# Patient Record
Sex: Female | Born: 1960 | Race: White | Hispanic: No | Smoking: Former smoker
Health system: Southern US, Community
[De-identification: ages and names within clinical notes are randomized; demographics above are authoritative.]

## PROBLEM LIST (undated history)

## (undated) DIAGNOSIS — R112 Nausea with vomiting, unspecified: Secondary | ICD-10-CM

## (undated) DIAGNOSIS — G43909 Migraine, unspecified, not intractable, without status migrainosus: Secondary | ICD-10-CM

## (undated) DIAGNOSIS — M1990 Unspecified osteoarthritis, unspecified site: Secondary | ICD-10-CM

## (undated) DIAGNOSIS — Z9889 Other specified postprocedural states: Secondary | ICD-10-CM

## (undated) DIAGNOSIS — I10 Essential (primary) hypertension: Secondary | ICD-10-CM

## (undated) DIAGNOSIS — F419 Anxiety disorder, unspecified: Secondary | ICD-10-CM

## (undated) DIAGNOSIS — K219 Gastro-esophageal reflux disease without esophagitis: Secondary | ICD-10-CM

## (undated) DIAGNOSIS — E785 Hyperlipidemia, unspecified: Secondary | ICD-10-CM

## (undated) HISTORY — DX: Migraine, unspecified, not intractable, without status migrainosus: G43.909

## (undated) HISTORY — PX: OTHER SURGICAL HISTORY: SHX169

## (undated) HISTORY — DX: Unspecified osteoarthritis, unspecified site: M19.90

## (undated) HISTORY — DX: Essential (primary) hypertension: I10

## (undated) HISTORY — DX: Hyperlipidemia, unspecified: E78.5

## (undated) HISTORY — DX: Gastro-esophageal reflux disease without esophagitis: K21.9

---

## 1998-05-27 HISTORY — PX: OTHER SURGICAL HISTORY: SHX169

## 2010-09-15 LAB — HM COLONOSCOPY

## 2013-07-29 LAB — HM COLONOSCOPY

## 2015-12-21 LAB — HM COLONOSCOPY

## 2016-07-10 LAB — LIPID PANEL
Cholesterol: 216 — AB (ref 0–200)
HDL: 46 (ref 35–70)
LDL Cholesterol: 115
LDl/HDL Ratio: 4.7
Triglycerides: 384 — AB (ref 40–160)

## 2016-07-10 LAB — HEPATIC FUNCTION PANEL
ALT: 17 (ref 7–35)
AST: 22 (ref 13–35)
Alkaline Phosphatase: 66 (ref 25–125)
Bilirubin, Direct: 0.1 (ref 0.01–0.4)
Bilirubin, Total: 0.6

## 2016-07-10 LAB — CBC AND DIFFERENTIAL
HCT: 42 (ref 36–46)
Hemoglobin: 14.1 (ref 12.0–16.0)
Neutrophils Absolute: 5032
Platelets: 222 (ref 150–399)
WBC: 7.9

## 2016-07-10 LAB — BASIC METABOLIC PANEL
Creatinine: 0.7 (ref 0.5–1.1)
Glucose: 93
Potassium: 4.2 (ref 3.4–5.3)
Sodium: 141 (ref 137–147)

## 2016-07-10 LAB — TSH: TSH: 1.84 (ref 0.41–5.90)

## 2016-09-14 LAB — HM COLONOSCOPY

## 2018-11-02 ENCOUNTER — Telehealth: Payer: Self-pay | Admitting: Family Medicine

## 2018-11-02 NOTE — Telephone Encounter (Signed)
Pt called in asking if Dr.Tabori will take her own as a new pt, she wanted  States that her Sister [employer] is a pt here.

## 2018-11-02 NOTE — Telephone Encounter (Signed)
Ok to establish 

## 2018-11-03 NOTE — Telephone Encounter (Signed)
LM making pt aware she can schedule a NP APPT when ready.

## 2018-11-27 NOTE — Telephone Encounter (Signed)
Patient is calling schedule New Patient appt. Virtually. Patient is interested in 12:30p no specific day Please advise [phone]

## 2018-12-21 ENCOUNTER — Ambulatory Visit (INDEPENDENT_AMBULATORY_CARE_PROVIDER_SITE_OTHER): Payer: Managed Care, Other (non HMO) | Admitting: Family Medicine

## 2018-12-21 ENCOUNTER — Encounter: Payer: Self-pay | Admitting: Family Medicine

## 2018-12-21 ENCOUNTER — Other Ambulatory Visit: Payer: Self-pay

## 2018-12-21 VITALS — BP 130/81 | HR 64 | Temp 97.1°F | Resp 16 | Ht 63.0 in | Wt 175.5 lb

## 2018-12-21 DIAGNOSIS — Z411 Encounter for cosmetic surgery: Secondary | ICD-10-CM

## 2018-12-21 DIAGNOSIS — Z1239 Encounter for other screening for malignant neoplasm of breast: Secondary | ICD-10-CM

## 2018-12-21 DIAGNOSIS — Z800 Family history of malignant neoplasm of digestive organs: Secondary | ICD-10-CM | POA: Insufficient documentation

## 2018-12-21 DIAGNOSIS — F411 Generalized anxiety disorder: Secondary | ICD-10-CM | POA: Diagnosis not present

## 2018-12-21 DIAGNOSIS — E782 Mixed hyperlipidemia: Secondary | ICD-10-CM | POA: Diagnosis not present

## 2018-12-21 DIAGNOSIS — E785 Hyperlipidemia, unspecified: Secondary | ICD-10-CM | POA: Insufficient documentation

## 2018-12-21 DIAGNOSIS — E669 Obesity, unspecified: Secondary | ICD-10-CM | POA: Diagnosis not present

## 2018-12-21 LAB — CBC WITH DIFFERENTIAL/PLATELET
Basophils Absolute: 0 10*3/uL (ref 0.0–0.1)
Basophils Relative: 0.2 % (ref 0.0–3.0)
Eosinophils Absolute: 0.1 10*3/uL (ref 0.0–0.7)
Eosinophils Relative: 1.1 % (ref 0.0–5.0)
HCT: 40 % (ref 36.0–46.0)
Hemoglobin: 13.2 g/dL (ref 12.0–15.0)
Lymphocytes Relative: 35.9 % (ref 12.0–46.0)
Lymphs Abs: 2.2 10*3/uL (ref 0.7–4.0)
MCHC: 33.2 g/dL (ref 30.0–36.0)
MCV: 83.9 fl (ref 78.0–100.0)
Monocytes Absolute: 0.4 10*3/uL (ref 0.1–1.0)
Monocytes Relative: 6.9 % (ref 3.0–12.0)
Neutro Abs: 3.4 10*3/uL (ref 1.4–7.7)
Neutrophils Relative %: 55.9 % (ref 43.0–77.0)
Platelets: 233 10*3/uL (ref 150.0–400.0)
RBC: 4.76 Mil/uL (ref 3.87–5.11)
RDW: 14 % (ref 11.5–15.5)
WBC: 6.1 10*3/uL (ref 4.0–10.5)

## 2018-12-21 LAB — BASIC METABOLIC PANEL
BUN: 10 mg/dL (ref 6–23)
CO2: 29 mEq/L (ref 19–32)
Calcium: 10 mg/dL (ref 8.4–10.5)
Chloride: 100 mEq/L (ref 96–112)
Creatinine, Ser: 0.68 mg/dL (ref 0.40–1.20)
GFR: 88.23 mL/min (ref >60.00)
Glucose, Bld: 84 mg/dL (ref 70–99)
Potassium: 4.1 mEq/L (ref 3.5–5.1)
Sodium: 138 mEq/L (ref 135–145)

## 2018-12-21 LAB — LIPID PANEL
Cholesterol: 222 mg/dL — ABNORMAL HIGH (ref 0–200)
HDL: 56.7 mg/dL (ref >39.00)
LDL Cholesterol: 134 mg/dL — ABNORMAL HIGH (ref 0–99)
NonHDL: 165.79
Total CHOL/HDL Ratio: 4
Triglycerides: 161 mg/dL — ABNORMAL HIGH (ref 0.0–149.0)
VLDL: 32.2 mg/dL (ref 0.0–40.0)

## 2018-12-21 LAB — HEPATIC FUNCTION PANEL
ALT: 18 U/L (ref 0–35)
AST: 20 U/L (ref 0–37)
Albumin: 4.8 g/dL (ref 3.5–5.2)
Alkaline Phosphatase: 66 U/L (ref 39–117)
Bilirubin, Direct: 0.1 mg/dL (ref 0.0–0.3)
Total Bilirubin: 0.9 mg/dL (ref 0.2–1.2)
Total Protein: 7.2 g/dL (ref 6.0–8.3)

## 2018-12-21 LAB — TSH: TSH: 1.36 u[IU]/mL (ref 0.35–4.50)

## 2018-12-21 NOTE — Patient Instructions (Addendum)
Schedule your complete physical in 6 months- we'll do a pap at that time [facility] notify you of your lab results and make any changes if needed Continue to work on healthy diet and regular exercise- goal is 30 minutes of exercise 4-5x/week We'll call you with your GI referral, plastic surgery referral, and mammogram Call with any questions or concerns Welcome!  We're glad to have you! Stay Safe!!!

## 2018-12-21 NOTE — Progress Notes (Signed)
   Subjective:    Patient ID: Namiyah Grantham, female    DOB: [birth year], 58 y.o.   MRN: [id number]  HPI New to establish.  Recently moved from [place].    Family history of colon cancer- sister dx'd at age 39, passed at age 27.  Has not had any abnormal colonoscopies herself w/ exception of 'twisted colon'.  Has been having colonoscopy q3 yrs.  Due now.  Anxiety- pt has GAD.  Currently on Prozac 60mg  daily.  Feels sxs are currently well controlled.  Some difficulty w/ sleep- takes OTC sleep aide.  Hyperlipidemia- pt reports LDL and triglycerides are both elevated.  Not currently on medication.  Obesity- pt is walking 3x/week.  Just purchased elliptical to use.  Pt is following FODMAP diet due to IBS.  Abstains from dairy.  Pt is having a difficult time losing weight despite eating well.  Health maintenance- due for mammo, pt has not had pap in ~10 yrs.   Review of Systems For ROS see HPI     Objective:   Physical Exam Vitals signs reviewed.  Constitutional:      General: She is not in acute distress.    Appearance: She is well-developed. She is obese.  HENT:     Head: Normocephalic and atraumatic.  Eyes:     Conjunctiva/sclera: Conjunctivae normal.     Pupils: Pupils are equal, round, and reactive to light.  Neck:     Musculoskeletal: Normal range of motion and neck supple.     Thyroid: No thyromegaly.  Cardiovascular:     Rate and Rhythm: Normal rate and regular rhythm.     Heart sounds: Normal heart sounds. No murmur.  Pulmonary:     Effort: Pulmonary effort is normal. No respiratory distress.     Breath sounds: Normal breath sounds.  Abdominal:     General: There is no distension.     Palpations: Abdomen is soft.     Tenderness: There is no abdominal tenderness.  Lymphadenopathy:     Cervical: No cervical adenopathy.  Skin:    General: Skin is warm and dry.  Neurological:     Mental Status: She is alert and oriented to person, place, and time.  Psychiatric:      Behavior: Behavior normal.           Assessment & Plan:

## 2018-12-22 NOTE — Assessment & Plan Note (Signed)
New to provider, ongoing for pt.  Feels that sxs are currently controlled on Prozac.  Not interested in med change at this time.  Will continue to follow.

## 2018-12-22 NOTE — Assessment & Plan Note (Signed)
Pt reports she has a history of elevated lipids.  She has not been on medication previously.  Will get baseline labs today and determine if medication is needed

## 2018-12-22 NOTE — Assessment & Plan Note (Signed)
Pt's sister was dx'd at age 60 and passed at 42.  Pt has been having colonoscopy q3 yrs.  Due for one at this time.  Referral placed.

## 2018-12-22 NOTE — Assessment & Plan Note (Signed)
New to provider, ongoing for pt.  She reports this became an issue s/p menopause.  Encouraged increased exercise as well as close attention to diet.  Check labs to risk stratify.  Will follow.

## 2019-01-22 ENCOUNTER — Institutional Professional Consult (permissible substitution): Payer: Managed Care, Other (non HMO) | Admitting: Plastic Surgery

## 2019-02-03 ENCOUNTER — Telehealth: Payer: Self-pay | Admitting: Gastroenterology

## 2019-02-03 NOTE — Telephone Encounter (Signed)
Hi Dr. Tarri Glenn, this patient have been referred for a repeat colon due to fm hx colon cancer. She had a colon 5 years ago. We have received her records. They will be placed on your desk for review. Patient is requesting to see a female physician. Please advise on scheduling.

## 2019-02-04 NOTE — Telephone Encounter (Signed)
I will be happy to review when I return from vacation next week. KLB

## 2019-02-10 ENCOUNTER — Telehealth: Payer: Self-pay | Admitting: Gastroenterology

## 2019-02-10 NOTE — Telephone Encounter (Signed)
Pt returned call and said she was going to speak with her PCP first before scheduling an OV because she does not understand the "guidelines"  Offered to schedule her a OV with Dr. Tarri Glenn to discuss but she wants to speak with PCP first.  Records placed in [employer] filing cabinet in the records reviewed folder

## 2019-02-12 ENCOUNTER — Encounter: Payer: Self-pay | Admitting: Family Medicine

## 2019-02-12 ENCOUNTER — Other Ambulatory Visit: Payer: Self-pay

## 2019-02-12 ENCOUNTER — Ambulatory Visit: Payer: Managed Care, Other (non HMO) | Admitting: Family Medicine

## 2019-02-12 VITALS — BP 130/86 | HR 76 | Temp 97.8°F | Resp 16 | Ht 63.0 in | Wt 180.0 lb

## 2019-02-12 DIAGNOSIS — H9193 Unspecified hearing loss, bilateral: Secondary | ICD-10-CM | POA: Diagnosis not present

## 2019-02-12 DIAGNOSIS — H9313 Tinnitus, bilateral: Secondary | ICD-10-CM | POA: Diagnosis not present

## 2019-02-12 DIAGNOSIS — Z23 Encounter for immunization: Secondary | ICD-10-CM

## 2019-02-12 NOTE — Patient Instructions (Signed)
Follow up as needed or as scheduled We'll call you with your ENT appt Noise reduction and hearing protection will reduce further damage Call with any questions or concerns Stay Safe!!!
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
## 2019-02-19 ENCOUNTER — Other Ambulatory Visit: Payer: Self-pay

## 2019-02-19 ENCOUNTER — Ambulatory Visit
Admission: RE | Admit: 2019-02-19 | Discharge: 2019-02-19 | Disposition: A | Discharge status: Home / Self Care | Payer: Managed Care, Other (non HMO) | Source: Ambulatory Visit | Attending: Family Medicine | Admitting: Family Medicine

## 2019-02-19 DIAGNOSIS — Z1239 Encounter for other screening for malignant neoplasm of breast: Secondary | ICD-10-CM

## 2019-03-12 ENCOUNTER — Other Ambulatory Visit: Payer: Self-pay | Admitting: Family Medicine

## 2019-03-12 DIAGNOSIS — R928 Other abnormal and inconclusive findings on diagnostic imaging of breast: Secondary | ICD-10-CM

## 2019-06-23 ENCOUNTER — Encounter: Payer: Self-pay | Admitting: Family Medicine

## 2019-06-23 ENCOUNTER — Ambulatory Visit (INDEPENDENT_AMBULATORY_CARE_PROVIDER_SITE_OTHER): Payer: Managed Care, Other (non HMO) | Admitting: Family Medicine

## 2019-06-23 ENCOUNTER — Other Ambulatory Visit: Payer: Self-pay

## 2019-06-23 VITALS — BP 124/84 | HR 60 | Temp 97.9°F | Resp 16 | Ht 63.0 in | Wt 180.0 lb

## 2019-06-23 DIAGNOSIS — E669 Obesity, unspecified: Secondary | ICD-10-CM | POA: Diagnosis not present

## 2019-06-23 DIAGNOSIS — E559 Vitamin D deficiency, unspecified: Secondary | ICD-10-CM | POA: Diagnosis not present

## 2019-06-23 DIAGNOSIS — Z0000 Encounter for general adult medical examination without abnormal findings: Secondary | ICD-10-CM | POA: Diagnosis not present

## 2019-06-23 DIAGNOSIS — Z124 Encounter for screening for malignant neoplasm of cervix: Secondary | ICD-10-CM | POA: Diagnosis not present

## 2019-06-23 LAB — CBC WITH DIFFERENTIAL/PLATELET
Basophils Absolute: 0 10*3/uL (ref 0.0–0.1)
Basophils Relative: 0.2 % (ref 0.0–3.0)
Eosinophils Absolute: 0.1 10*3/uL (ref 0.0–0.7)
Eosinophils Relative: 1 % (ref 0.0–5.0)
HCT: 40.5 % (ref 36.0–46.0)
Hemoglobin: 13.3 g/dL (ref 12.0–15.0)
Lymphocytes Relative: 38.2 % (ref 12.0–46.0)
Lymphs Abs: 2 10*3/uL (ref 0.7–4.0)
MCHC: 32.8 g/dL (ref 30.0–36.0)
MCV: 82.7 fl (ref 78.0–100.0)
Monocytes Absolute: 0.4 10*3/uL (ref 0.1–1.0)
Monocytes Relative: 7.5 % (ref 3.0–12.0)
Neutro Abs: 2.8 10*3/uL (ref 1.4–7.7)
Neutrophils Relative %: 53.1 % (ref 43.0–77.0)
Platelets: 217 10*3/uL (ref 150.0–400.0)
RBC: 4.9 Mil/uL (ref 3.87–5.11)
RDW: 13.9 % (ref 11.5–15.5)
WBC: 5.4 10*3/uL (ref 4.0–10.5)

## 2019-06-23 LAB — BASIC METABOLIC PANEL
BUN: 11 mg/dL (ref 6–23)
CO2: 30 mEq/L (ref 19–32)
Calcium: 9.7 mg/dL (ref 8.4–10.5)
Chloride: 101 mEq/L (ref 96–112)
Creatinine, Ser: 0.6 mg/dL (ref 0.40–1.20)
GFR: 101.77 mL/min (ref >60.00)
Glucose, Bld: 81 mg/dL (ref 70–99)
Potassium: 4 mEq/L (ref 3.5–5.1)
Sodium: 138 mEq/L (ref 135–145)

## 2019-06-23 LAB — LIPID PANEL
Cholesterol: 238 mg/dL — ABNORMAL HIGH (ref 0–200)
HDL: 55.7 mg/dL (ref >39.00)
LDL Cholesterol: 149 mg/dL — ABNORMAL HIGH (ref 0–99)
NonHDL: 182.74
Total CHOL/HDL Ratio: 4
Triglycerides: 167 mg/dL — ABNORMAL HIGH (ref 0.0–149.0)
VLDL: 33.4 mg/dL (ref 0.0–40.0)

## 2019-06-23 LAB — HEPATIC FUNCTION PANEL
ALT: 21 U/L (ref 0–35)
AST: 21 U/L (ref 0–37)
Albumin: 4.7 g/dL (ref 3.5–5.2)
Alkaline Phosphatase: 69 U/L (ref 39–117)
Bilirubin, Direct: 0.1 mg/dL (ref 0.0–0.3)
Total Bilirubin: 0.8 mg/dL (ref 0.2–1.2)
Total Protein: 7.1 g/dL (ref 6.0–8.3)

## 2019-06-23 LAB — TSH: TSH: 2.36 u[IU]/mL (ref 0.35–4.50)

## 2019-06-23 NOTE — Assessment & Plan Note (Signed)
Pt's PE WNL w/ exception of obesity.  UTD on colonoscopy, mammo, immunizations.  Attempted pap today but pt was not able to relax enough to tolerate and in her best interest, after 3 attempts we aborted our efforts.  I offered a GYN referral due to pt's level of pain but she declined and said she had no concerns or issues.  Check labs.  Anticipatory guidance provided.

## 2019-06-23 NOTE — Progress Notes (Signed)
   Subjective:    Patient ID: Erin Williamson, female    DOB: Jul 23, 1960, 59 y.o.   MRN: [id number]  HPI CPE- UTD on colonoscopy, mammo, flu.  Due for pap today.   Review of Systems Patient reports no vision/ hearing changes, adenopathy,fever, weight change,  persistant/recurrent hoarseness , swallowing issues, chest pain, palpitations, edema, persistant/recurrent cough, hemoptysis, dyspnea (rest/exertional/paroxysmal nocturnal), gastrointestinal bleeding (melena, rectal bleeding), abdominal pain, significant heartburn, bowel changes, GU symptoms (dysuria, hematuria, incontinence), Gyn symptoms (abnormal  bleeding, pain),  syncope, focal weakness, memory loss, numbness & tingling, skin/hair/nail changes, abnormal bruising or bleeding, anxiety, or depression.   This visit occurred during the SARS-CoV-2 public health emergency.  Safety protocols were in place, including screening questions prior to the visit, additional usage of staff PPE, and extensive cleaning of exam room while observing appropriate contact time as indicated for disinfecting solutions.       Objective:   Physical Exam  General Appearance:    Alert, cooperative, no distress, appears stated age  Head:    Normocephalic, without obvious abnormality, atraumatic  Eyes:    PERRL, conjunctiva/corneas clear, EOM's intact, fundi    benign, both eyes  Ears:    Normal TM's and external ear canals, both ears  Nose:   Nares normal, septum midline, mucosa normal, no drainage    or sinus tenderness  Throat:   Lips, mucosa, and tongue normal; teeth and gums normal  Neck:   Supple, symmetrical, trachea midline, no adenopathy;    Thyroid: no enlargement/tenderness/nodules  Back:     Symmetric, no curvature, ROM normal, no CVA tenderness  Lungs:     Clear to auscultation bilaterally, respirations unlabored  Chest Wall:    No tenderness or deformity   Heart:    Regular rate and rhythm, S1 and S2 normal, no murmur, rub   or gallop  Breast  Exam:    No tenderness, masses, or nipple abnormality  Abdomen:     Soft, non-tender, bowel sounds active all four quadrants,    no masses, no organomegaly  Genitalia:    External genitalia normal, cervix normal in appearance, mucosa pink and moist, no lesions or discharge present, pt did not tolerate speculum exam enough to collect pap  Rectal:    Normal external appearance  Extremities:   Extremities normal, atraumatic, no cyanosis or edema  Pulses:   2+ and symmetric all extremities  Skin:   Skin color, texture, turgor normal, no rashes or lesions  Lymph nodes:   Cervical, supraclavicular, and axillary nodes normal  Neurologic:   CNII-XII intact, normal strength, sensation and reflexes    throughout          Assessment & Plan:

## 2019-06-23 NOTE — Assessment & Plan Note (Signed)
Check labs and replete prn. 

## 2019-06-23 NOTE — Assessment & Plan Note (Signed)
Ongoing issue.  Encouraged healthy diet and regular exercise.  Check labs to risk stratify.  Will follow.

## 2019-06-23 NOTE — Patient Instructions (Signed)
Follow up in 1 year or as needed We'll notify you of your lab results and make any changes if needed Continue to work on healthy diet and regular exercise Call with any questions or concerns Stay Safe!  Stay healthy!

## 2019-07-05 ENCOUNTER — Telehealth: Payer: Self-pay | Admitting: Family Medicine

## 2019-07-05 NOTE — Telephone Encounter (Signed)
FYI

## 2019-07-05 NOTE — Telephone Encounter (Signed)
Pt looking for Vit D lab results from late January visit.

## 2019-07-05 NOTE — Telephone Encounter (Signed)
Spoke with Santiago Glad at the lab, test was not performed. Called and offered patient lab appt at our office and [place] location to be accommodating for her work schedule, patient declines. States she will have it checked when she comes in for next visit. Vit D still in chart as future.

## 2019-07-05 NOTE — Telephone Encounter (Signed)
Were these ever collected?

## 2019-09-07 ENCOUNTER — Other Ambulatory Visit: Payer: Self-pay | Admitting: Family Medicine

## 2019-09-07 MED ORDER — FLUOXETINE HCL 20 MG PO CAPS: 40.0000 mg | ORAL_CAPSULE | Freq: Every day | ORAL | 1 refills | Status: DC

## 2019-09-07 NOTE — Telephone Encounter (Signed)
Pt called in asking for a refill on the Fluoxetine  [facility] [address].

## 2019-09-07 NOTE — Telephone Encounter (Signed)
Please advise, this is listed as historical provider.

## 2020-02-13 ENCOUNTER — Encounter: Payer: Self-pay | Admitting: Family Medicine

## 2020-02-21 MED ORDER — FLUOXETINE HCL 20 MG PO CAPS: 60.0000 mg | ORAL_CAPSULE | Freq: Every day | ORAL | 1 refills | Status: DC

## 2020-03-12 ENCOUNTER — Other Ambulatory Visit: Payer: Self-pay | Admitting: Family Medicine

## 2020-04-06 ENCOUNTER — Encounter: Payer: Self-pay | Admitting: Family Medicine

## 2020-04-06 ENCOUNTER — Telehealth: Payer: Self-pay | Admitting: Family Medicine

## 2020-04-06 NOTE — Telephone Encounter (Signed)
Pt called in asking if we can send an order for a mammogram to GI- [facility]

## 2020-04-06 NOTE — Telephone Encounter (Signed)
Ok for screening mammo (dx z12.31) at [facility]

## 2020-04-06 NOTE — Telephone Encounter (Signed)
Patient wants to know if we can send an order for a mammogram to GI. Please advise!

## 2020-04-07 ENCOUNTER — Other Ambulatory Visit: Payer: Self-pay

## 2020-04-07 DIAGNOSIS — Z1231 Encounter for screening mammogram for malignant neoplasm of breast: Secondary | ICD-10-CM

## 2020-04-07 NOTE — Telephone Encounter (Signed)
Order placed for mammogram screening.

## 2020-04-24 ENCOUNTER — Other Ambulatory Visit: Payer: Self-pay

## 2020-04-24 ENCOUNTER — Other Ambulatory Visit: Payer: Self-pay | Admitting: Family Medicine

## 2020-04-24 ENCOUNTER — Ambulatory Visit
Admission: RE | Admit: 2020-04-24 | Discharge: 2020-04-24 | Disposition: A | Discharge status: Home / Self Care | Payer: Managed Care, Other (non HMO) | Source: Ambulatory Visit | Attending: Family Medicine | Admitting: Family Medicine

## 2020-04-24 DIAGNOSIS — Z1231 Encounter for screening mammogram for malignant neoplasm of breast: Secondary | ICD-10-CM

## 2020-04-26 ENCOUNTER — Ambulatory Visit: Payer: Managed Care, Other (non HMO) | Admitting: Family Medicine

## 2020-06-23 ENCOUNTER — Encounter: Payer: Self-pay | Admitting: Family Medicine

## 2020-06-23 ENCOUNTER — Other Ambulatory Visit: Payer: Self-pay

## 2020-06-23 ENCOUNTER — Ambulatory Visit (INDEPENDENT_AMBULATORY_CARE_PROVIDER_SITE_OTHER): Payer: Managed Care, Other (non HMO) | Admitting: Family Medicine

## 2020-06-23 VITALS — BP 118/68 | HR 62 | Temp 97.8°F | Resp 20 | Ht 63.0 in | Wt 179.6 lb

## 2020-06-23 DIAGNOSIS — E559 Vitamin D deficiency, unspecified: Secondary | ICD-10-CM

## 2020-06-23 DIAGNOSIS — E669 Obesity, unspecified: Secondary | ICD-10-CM | POA: Diagnosis not present

## 2020-06-23 DIAGNOSIS — Z0000 Encounter for general adult medical examination without abnormal findings: Secondary | ICD-10-CM | POA: Diagnosis not present

## 2020-06-23 DIAGNOSIS — G8929 Other chronic pain: Secondary | ICD-10-CM

## 2020-06-23 DIAGNOSIS — M25561 Pain in right knee: Secondary | ICD-10-CM

## 2020-06-23 DIAGNOSIS — M25562 Pain in left knee: Secondary | ICD-10-CM

## 2020-06-23 LAB — CBC WITH DIFFERENTIAL/PLATELET
Basophils Absolute: 0 10*3/uL (ref 0.0–0.1)
Basophils Relative: 0.3 % (ref 0.0–3.0)
Eosinophils Absolute: 0.1 10*3/uL (ref 0.0–0.7)
Eosinophils Relative: 1.4 % (ref 0.0–5.0)
HCT: 39.1 % (ref 36.0–46.0)
Hemoglobin: 13.1 g/dL (ref 12.0–15.0)
Lymphocytes Relative: 36.2 % (ref 12.0–46.0)
Lymphs Abs: 2.1 10*3/uL (ref 0.7–4.0)
MCHC: 33.6 g/dL (ref 30.0–36.0)
MCV: 81.7 fl (ref 78.0–100.0)
Monocytes Absolute: 0.4 10*3/uL (ref 0.1–1.0)
Monocytes Relative: 7 % (ref 3.0–12.0)
Neutro Abs: 3.2 10*3/uL (ref 1.4–7.7)
Neutrophils Relative %: 55.1 % (ref 43.0–77.0)
Platelets: 229 10*3/uL (ref 150.0–400.0)
RBC: 4.78 Mil/uL (ref 3.87–5.11)
RDW: 14.2 % (ref 11.5–15.5)
WBC: 5.7 10*3/uL (ref 4.0–10.5)

## 2020-06-23 LAB — LIPID PANEL
Cholesterol: 233 mg/dL — ABNORMAL HIGH (ref 0–200)
HDL: 57.9 mg/dL (ref >39.00)
LDL Cholesterol: 144 mg/dL — ABNORMAL HIGH (ref 0–99)
NonHDL: 175.07
Total CHOL/HDL Ratio: 4
Triglycerides: 156 mg/dL — ABNORMAL HIGH (ref 0.0–149.0)
VLDL: 31.2 mg/dL (ref 0.0–40.0)

## 2020-06-23 LAB — BASIC METABOLIC PANEL
BUN: 16 mg/dL (ref 6–23)
CO2: 31 mEq/L (ref 19–32)
Calcium: 9.7 mg/dL (ref 8.4–10.5)
Chloride: 101 mEq/L (ref 96–112)
Creatinine, Ser: 0.66 mg/dL (ref 0.40–1.20)
GFR: 94.55 mL/min (ref >60.00)
Glucose, Bld: 81 mg/dL (ref 70–99)
Potassium: 3.8 mEq/L (ref 3.5–5.1)
Sodium: 137 mEq/L (ref 135–145)

## 2020-06-23 LAB — HEPATIC FUNCTION PANEL
ALT: 18 U/L (ref 0–35)
AST: 21 U/L (ref 0–37)
Albumin: 4.6 g/dL (ref 3.5–5.2)
Alkaline Phosphatase: 71 U/L (ref 39–117)
Bilirubin, Direct: 0.1 mg/dL (ref 0.0–0.3)
Total Bilirubin: 0.9 mg/dL (ref 0.2–1.2)
Total Protein: 7.5 g/dL (ref 6.0–8.3)

## 2020-06-23 LAB — TSH: TSH: 1.53 u[IU]/mL (ref 0.35–4.50)

## 2020-06-23 LAB — VITAMIN D 25 HYDROXY (VIT D DEFICIENCY, FRACTURES): VITD: 35.11 ng/mL (ref 30.00–100.00)

## 2020-06-23 NOTE — Progress Notes (Signed)
   Subjective:    Patient ID: Erin Williamson, female    DOB: 01-02-1961, 60 y.o.   MRN: [id number]  HPI CPE-UTD on colonoscopy, mammo, COVID.  Declines flu and Tdap.  Declines pap  Reviewed past medical, surgical, family and social histories.   Health Maintenance  Topic Date Due  . COVID-19 Vaccine (2 - Booster for [employer] series) 09/30/2019  . INFLUENZA VACCINE  08/24/2020 (Originally 12/26/2019)  . TETANUS/TDAP  01/09/2021 (Originally 11/12/1977)  . Hepatitis C Screening  06/23/2021 (Originally 1960-11-21)  . HIV Screening  06/23/2021 (Originally 11/12/1973)  . PAP SMEAR-Modifier  06/23/2023 (Originally 11/13/1979)  . MAMMOGRAM  04/24/2022  . COLONOSCOPY (Pts 45-6yrs Insurance coverage will need to be confirmed)  12/20/2025      Review of Systems Patient reports no vision/ hearing changes, adenopathy,fever, weight change,  persistant/recurrent hoarseness , swallowing issues, chest pain, palpitations, edema, persistant/recurrent cough, hemoptysis, dyspnea (rest/exertional/paroxysmal nocturnal), gastrointestinal bleeding (melena, rectal bleeding), abdominal pain, significant heartburn, bowel changes, GU symptoms (dysuria, hematuria, incontinence), Gyn symptoms (abnormal  bleeding, pain),  syncope, focal weakness, memory loss, numbness & tingling, skin/hair/nail changes, abnormal bruising or bleeding, anxiety, or depression.   Bilateral knee pain- L>R  This visit occurred during the SARS-CoV-2 public health emergency.  Safety protocols were in place, including screening questions prior to the visit, additional usage of staff PPE, and extensive cleaning of exam room while observing appropriate contact time as indicated for disinfecting solutions.       Objective:   Physical Exam General Appearance:    Alert, cooperative, no distress, appears stated age, obese  Head:    Normocephalic, without obvious abnormality, atraumatic  Eyes:    PERRL, conjunctiva/corneas clear, EOM's intact, fundi     benign, both eyes  Ears:    Normal TM's and external ear canals, both ears  Nose:   Nares normal, septum midline, mucosa normal, no drainage    or sinus tenderness  Throat:   Lips, mucosa, and tongue normal; teeth and gums normal  Neck:   Supple, symmetrical, trachea midline, no adenopathy;    Thyroid: no enlargement/tenderness/nodules  Back:     Symmetric, no curvature, ROM normal, no CVA tenderness  Lungs:     Clear to auscultation bilaterally, respirations unlabored  Chest Wall:    No tenderness or deformity   Heart:    Regular rate and rhythm, S1 and S2 normal, no murmur, rub   or gallop  Breast Exam:    Deferred to mammo  Abdomen:     Soft, non-tender, bowel sounds active all four quadrants,    no masses, no organomegaly  Genitalia:    Refuses  Rectal:    Extremities:   Extremities normal, atraumatic, no cyanosis or edema  Pulses:   2+ and symmetric all extremities  Skin:   Skin color, texture, turgor normal, no rashes or lesions  Lymph nodes:   Cervical, supraclavicular, and axillary nodes normal  Neurologic:   CNII-XII intact, normal strength, sensation and reflexes    throughout          Assessment & Plan:

## 2020-06-23 NOTE — Patient Instructions (Signed)
Follow up in 1 year or as needed We'll notify you of your lab results and make any changes if needed Continue to work on healthy diet and regular exercise- you can do it! We'll call you with your orthopedic appt for the knees Please get your COVID booster Call with any questions or concerns Stay Safe!  Stay Healthy!

## 2020-06-25 NOTE — Assessment & Plan Note (Signed)
BMI 31.81  Stressed need for healthy diet and regular exercise.  Check labs to risk stratify.  Will follow

## 2020-06-25 NOTE — Assessment & Plan Note (Signed)
Pt's PE WNL w/ exception of obesity.  UTD on mammo, colonoscopy.  Refuses pap.  Refuses flu shot, Tdap, COVID booster.  Check labs.  Anticipatory guidance provided.

## 2020-06-25 NOTE — Assessment & Plan Note (Signed)
Pt has hx of this.  Check labs and replete prn. 

## 2020-06-26 ENCOUNTER — Encounter: Payer: Self-pay | Admitting: *Deleted

## 2020-07-07 ENCOUNTER — Encounter: Payer: Self-pay | Admitting: Family Medicine

## 2020-07-07 ENCOUNTER — Ambulatory Visit: Payer: Managed Care, Other (non HMO) | Admitting: Family Medicine

## 2020-07-07 ENCOUNTER — Ambulatory Visit: Payer: Self-pay

## 2020-07-07 ENCOUNTER — Other Ambulatory Visit: Payer: Self-pay

## 2020-07-07 ENCOUNTER — Ambulatory Visit (INDEPENDENT_AMBULATORY_CARE_PROVIDER_SITE_OTHER): Payer: Managed Care, Other (non HMO)

## 2020-07-07 DIAGNOSIS — M25561 Pain in right knee: Secondary | ICD-10-CM

## 2020-07-07 DIAGNOSIS — G8929 Other chronic pain: Secondary | ICD-10-CM | POA: Diagnosis not present

## 2020-07-07 DIAGNOSIS — M25562 Pain in left knee: Secondary | ICD-10-CM

## 2020-07-07 MED ORDER — MELOXICAM 15 MG PO TABS: 7.5000 mg | ORAL_TABLET | Freq: Every day | ORAL | 6 refills | Status: DC | PRN

## 2020-07-07 NOTE — Patient Instructions (Signed)
    -   Glucosamine Sulfate 1,000 mg twice daily  - Turmeric 500 mg twice daily  - Quadriceps strengthening  - Hamstring stretching    Knee brace:  [email or web address]   Other future options:  - Cortisone injection  - "Gel" injection (hyaluronic acid)  - Platelet-rich plasma (PRP)

## 2020-07-07 NOTE — Progress Notes (Signed)
Office Visit Note   Patient: Erin Williamson           Date of Birth: [birth year]           MRN: [id number] Visit Date: 07/07/2020 Requested by: Midge Minium, MD [address] [place],  [place] [address] PCP: Midge Minium, MD  Subjective: Chief Complaint  Patient presents with  . Right Knee - Pain    Chronic pain bilateral knees, left worse than right. Popping. Swelling bilaterally.  . Left Knee - Pain    HPI: She is here with left greater than right knee pain.  Longstanding problems with her knees, probably more than 10 years.  When she was in [place] she had an MRI scan of the left knee.  No surgery was done.  She has managed her pain with over-the-counter remedies.  Lately the pain seems to have gotten worse, especially the left knee.  It pops frequently, it hurts to squat or kneel.  When her daughter had arthroscopic knee surgery, she had anti-inflammatories and patient tried one once with good results but she cannot remember the name of it.  She is not currently taking any anti-inflammatories but she does consume a lot of turmeric.                ROS:   All other systems were reviewed and are negative.  Objective: Vital Signs: There were no vitals taken for this visit.  Physical Exam:  General:  Alert and oriented, in no acute distress. Pulm:  Breathing unlabored. Psy:  Normal mood, congruent affect. Skin: No rash Knees: She has 2+ patellofemoral crepitus bilaterally.  Mild pain with patella compression each knee.  1+ effusion on the left, trace on the right.  Ligaments feel stable but she does have pseudolaxity with valgus stress in both knees.  Moderately tender on the medial joint line of both knees but no palpable click with McMurray's.    Imaging: XR KNEE 3 VIEW LEFT  Result Date: 07/07/2020 Three-view x-rays of the left knee reveal moderate to severe medial compartment narrowing, with moderate patellofemoral spurring.  No sign of loose body or  stress fracture.  XR KNEE 3 VIEW RIGHT  Result Date: 07/07/2020 Three-view x-rays of the right knee reveal moderate to severe medial compartment joint space narrowing with moderate patellofemoral spurring.   Assessment & Plan: 1.  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  If symptoms worsen, could contemplate a one-time steroid injection or possibly gel injections.  Medial compartment unloading brace is another consideration.     Procedures: No procedures performed        PMFS History: Patient Active Problem List   Diagnosis Date Noted  . Physical exam 06/23/2019  . Vitamin D deficiency 06/23/2019  . Family history of colon cancer 12/21/2018  . Generalized anxiety disorder 12/21/2018  . Hyperlipidemia 12/21/2018  . Obesity (BMI 30-39.9) 12/21/2018   Past Medical History:  Diagnosis Date  . Arthritis   . GERD (gastroesophageal reflux disease)   . Hyperlipidemia   . Hypertension   . Migraine     Family History  Problem Relation Age of Onset  . COPD Mother   . Heart attack Mother   . Heart disease Mother   . Hyperlipidemia Mother   . Hypertension Mother   . Early death Father   . Stroke  Sister   . Mental illness Son   . Arthritis Sister   . Cancer Sister        colon    Past Surgical History:  Procedure Laterality Date  . [place] [address]  . tummy tuck     Social History   Occupational History  . Not on file  Tobacco Use  . Smoking status: Never Smoker  . Smokeless tobacco: Never Used  Vaping Use  . Vaping Use: Never used  Substance and Sexual Activity  . Alcohol use: Yes  . Drug use: Never  . Sexual activity: Not Currently

## 2020-09-23 ENCOUNTER — Other Ambulatory Visit: Payer: Self-pay | Admitting: Family Medicine

## 2020-10-12 ENCOUNTER — Encounter: Payer: Self-pay | Admitting: Family Medicine

## 2020-10-12 ENCOUNTER — Encounter: Payer: Managed Care, Other (non HMO) | Admitting: Family Medicine

## 2020-10-12 NOTE — Progress Notes (Signed)
I connected with  Erin Williamson on 10/12/20 by a video enabled telemedicine application and verified that I am speaking with the correct person using two identifiers.   I discussed the limitations of evaluation and management by telemedicine. The patient expressed understanding and agreed to proceed.

## 2020-10-12 NOTE — Progress Notes (Signed)
This encounter was created in error - please disregard.

## 2020-10-16 ENCOUNTER — Telehealth: Payer: Self-pay | Admitting: Family Medicine

## 2020-10-16 NOTE — Telephone Encounter (Signed)
She was COVID + last week and should not be having dental work tomorrow as she has not been in [employer]

## 2020-10-16 NOTE — Telephone Encounter (Signed)
Unless she has a shunt in her head, no antibiotics are required after intracranial surgery once 3 months have passed.  No antibiotics are needed for her upcoming dental appt

## 2020-10-16 NOTE — Telephone Encounter (Signed)
Patient states she fist tested positive on 5/13/ and would like to know when do you advise it is safe for her to have dental work?

## 2020-10-16 NOTE — Telephone Encounter (Signed)
Since it would require them to be in her mouth and exposed, I would say 14 days.  But ultimately that is up to her dentist

## 2020-10-16 NOTE — Telephone Encounter (Signed)
Patient aware.

## 2020-10-16 NOTE — Telephone Encounter (Signed)
Patient states she will reschedule her appointment to be on the safe side but if we can send in the medications to be on hand when she needs them.

## 2020-10-16 NOTE — Telephone Encounter (Signed)
I don't have documentation of a medical issue or past surgical issue that would require antibiotic prophylaxis prior to dental work.  What does she need the medication for?

## 2020-10-16 NOTE — Telephone Encounter (Signed)
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 She had surgery for that where they had to cut into her head. She states they told her that she would need to premedicate. She states she has not been to the dentist in a while but can try and get documentation if needed.

## 2020-10-16 NOTE — Telephone Encounter (Signed)
Patient is having dental work done tomorrow - she requires pre treatment antibiotic - [facility] [address] in [place].  Please advise.

## 2020-11-16 ENCOUNTER — Encounter: Payer: Self-pay | Admitting: Family Medicine

## 2020-11-22 ENCOUNTER — Encounter: Payer: Self-pay | Admitting: *Deleted

## 2021-04-03 ENCOUNTER — Emergency Department (HOSPITAL_COMMUNITY): Payer: Managed Care, Other (non HMO)

## 2021-04-03 ENCOUNTER — Encounter (HOSPITAL_COMMUNITY): Payer: Self-pay

## 2021-04-03 ENCOUNTER — Ambulatory Visit: Payer: Managed Care, Other (non HMO) | Admitting: Registered Nurse

## 2021-04-03 ENCOUNTER — Emergency Department (HOSPITAL_COMMUNITY)
Admission: EM | Admit: 2021-04-03 | Discharge: 2021-04-03 | Disposition: A | Discharge status: Home / Self Care | Payer: Managed Care, Other (non HMO) | Attending: Student | Admitting: Student

## 2021-04-03 DIAGNOSIS — R1013 Epigastric pain: Secondary | ICD-10-CM | POA: Diagnosis not present

## 2021-04-03 DIAGNOSIS — I10 Essential (primary) hypertension: Secondary | ICD-10-CM | POA: Insufficient documentation

## 2021-04-03 DIAGNOSIS — R9389 Abnormal findings on diagnostic imaging of other specified body structures: Secondary | ICD-10-CM

## 2021-04-03 DIAGNOSIS — F419 Anxiety disorder, unspecified: Secondary | ICD-10-CM | POA: Insufficient documentation

## 2021-04-03 LAB — COMPREHENSIVE METABOLIC PANEL
ALT: 24 U/L (ref 0–44)
AST: 25 U/L (ref 15–41)
Albumin: 4.8 g/dL (ref 3.5–5.0)
Alkaline Phosphatase: 66 U/L (ref 38–126)
Anion gap: 9 (ref 5–15)
BUN: 12 mg/dL (ref 8–23)
CO2: 28 mmol/L (ref 22–32)
Calcium: 9.8 mg/dL (ref 8.9–10.3)
Chloride: 102 mmol/L (ref 98–111)
Creatinine, Ser: 0.51 mg/dL (ref 0.44–1.00)
GFR, Estimated: >60 mL/min (ref >60)
Glucose, Bld: 114 mg/dL — ABNORMAL HIGH (ref 70–99)
Potassium: 3.8 mmol/L (ref 3.5–5.1)
Sodium: 139 mmol/L (ref 135–145)
Total Bilirubin: 1.2 mg/dL (ref 0.3–1.2)
Total Protein: 8.3 g/dL — ABNORMAL HIGH (ref 6.5–8.1)

## 2021-04-03 LAB — CBC WITH DIFFERENTIAL/PLATELET
Abs Immature Granulocytes: 0.02 10*3/uL (ref 0.00–0.07)
Basophils Absolute: 0 10*3/uL (ref 0.0–0.1)
Basophils Relative: 0 %
Eosinophils Absolute: 0 10*3/uL (ref 0.0–0.5)
Eosinophils Relative: 0 %
HCT: 44.3 % (ref 36.0–46.0)
Hemoglobin: 15 g/dL (ref 12.0–15.0)
Immature Granulocytes: 0 %
Lymphocytes Relative: 14 %
Lymphs Abs: 1.2 10*3/uL (ref 0.7–4.0)
MCH: 28 pg (ref 26.0–34.0)
MCHC: 33.9 g/dL (ref 30.0–36.0)
MCV: 82.8 fL (ref 80.0–100.0)
Monocytes Absolute: 0.4 10*3/uL (ref 0.1–1.0)
Monocytes Relative: 5 %
Neutro Abs: 6.6 10*3/uL (ref 1.7–7.7)
Neutrophils Relative %: 81 %
Platelets: 289 10*3/uL (ref 150–400)
RBC: 5.35 MIL/uL — ABNORMAL HIGH (ref 3.87–5.11)
RDW: 14.1 % (ref 11.5–15.5)
WBC: 8.2 10*3/uL (ref 4.0–10.5)
nRBC: 0 % (ref 0.0–0.2)

## 2021-04-03 LAB — TROPONIN I (HIGH SENSITIVITY): Troponin I (High Sensitivity): 3 ng/L (ref <18)

## 2021-04-03 LAB — LIPASE, BLOOD: Lipase: 33 U/L (ref 11–51)

## 2021-04-03 MED ORDER — LORAZEPAM 0.5 MG PO TABS
0.5000 mg | ORAL_TABLET | Freq: Once | ORAL | Status: AC
  Administered 2021-04-03: 0.5 mg via ORAL
  Filled 2021-04-03: qty 1

## 2021-04-03 MED ORDER — IOHEXOL 350 MG/ML SOLN
80.0000 mL | Freq: Once | INTRAVENOUS | Status: AC | PRN
  Administered 2021-04-03: 80 mL via INTRAVENOUS

## 2021-04-03 MED ORDER — LORAZEPAM 1 MG PO TABS: 1.0000 mg | ORAL_TABLET | Freq: Three times a day (TID) | ORAL | 0 refills | Status: DC | PRN

## 2021-04-03 MED ORDER — LORAZEPAM 1 MG PO TABS: 0.5000 mg | ORAL_TABLET | Freq: Three times a day (TID) | ORAL | 0 refills | Status: DC | PRN

## 2021-04-03 NOTE — Discharge Instructions (Addendum)
You were seen in the emergency department for evaluation of anxiety and abdominal pain.  I will happily prescribe a very short course of Ativan to bridge you to further discussions with your primary care physician about possible panic disorder, but this medication cannot be refilled here in the emergency department.  It can only be refilled by your primary care physician after a one-time prescription today.  Your CAT scan today incidentally found some endometrial thickening which may be indicative of possible developing endometrial cancer.  We had a long discussion about this finding, and I spoke with Dr. Aletha Halim with OB/GYN and the number for his office is listed above.  He will help coordinate outpatient follow-up but please call this number to close the loop.  At this time you are safe for discharge and please call your primary care physician for follow-up.

## 2021-04-03 NOTE — ED Provider Notes (Signed)
[facility] DEPT Provider Note   CSN: [id number] Arrival date & time: 04/03/21  0756     History Chief Complaint  Patient presents with   Anxiety    Erin Williamson is a 60 y.o. female.  With PMH anxiety, panic attacks, HTN, HLD who presents to the emergency department for evaluation of anxiety and abdominal pain.  Patient states that she is on Prozac daily but has been recently waking up and having panic attacks.  She previously had a prescription for Xanax that she has since run out of since moving from [place].  She has not used this medication for multiple months.  She endorses epigastric abdominal pain that is worse when she feels high stress.  Denies chest pain, shortness of breath, nausea, vomiting, headache, fever or other systemic symptoms.  Denies vaginal bleeding.   Anxiety Associated symptoms include abdominal pain. Pertinent negatives include no chest pain and no shortness of breath.      Past Medical History:  Diagnosis Date   Arthritis    GERD (gastroesophageal reflux disease)    Hyperlipidemia    Hypertension    Migraine     Patient Active Problem List   Diagnosis Date Noted   Physical exam 06/23/2019   Vitamin D deficiency 06/23/2019   Family history of colon cancer 12/21/2018   Generalized anxiety disorder 12/21/2018   Hyperlipidemia 12/21/2018   Obesity (BMI 30-39.9) 12/21/2018    Past Surgical History:  Procedure Laterality Date   CESAREAN SECTION     1987 and 197   tummy tuck       OB History   No obstetric history on file.     Family History  Problem Relation Age of Onset   COPD Mother    Heart attack Mother    Heart disease Mother    Hyperlipidemia Mother    Hypertension Mother    Early death Father    Stroke Sister    Mental illness Son    Arthritis Sister    Cancer Sister        colon    Social History   Tobacco Use   Smoking status: Never   Smokeless tobacco: Never  Vaping Use   Vaping  Use: Never used  Substance Use Topics   Alcohol use: Yes   Drug use: Never    Home Medications Prior to Admission medications   Medication Sig Start Date End Date Taking? Authorizing Provider  Cholecalciferol (VITAMIN D-3 PO) Take by mouth.    [provider]  FLUoxetine (PROZAC) 20 MG capsule TAKE 3 CAPSULES BY MOUTH EVERY DAY 09/25/20   Midge Minium, MD  MAGNESIUM PO Take by mouth.    [provider]  meloxicam (MOBIC) 15 MG tablet Take 0.5-1 tablets (7.5-15 mg total) by mouth daily as needed for pain. 07/07/20   Hilts, Legrand Como, MD  Multiple Vitamin (MULTIVITAMIN PO) Take by mouth.    [provider]    Allergies    Ofloxacin  Review of Systems   Review of Systems  Constitutional:  Negative for chills and fever.  HENT:  Negative for ear pain and sore throat.   Eyes:  Negative for pain and visual disturbance.  Respiratory:  Negative for cough and shortness of breath.   Cardiovascular:  Negative for chest pain and palpitations.  Gastrointestinal:  Positive for abdominal pain. Negative for vomiting.  Genitourinary:  Negative for dysuria and hematuria.  Musculoskeletal:  Negative for arthralgias and back pain.  Skin:  Negative for color change and rash.  Neurological:  Negative for seizures and syncope.  Psychiatric/Behavioral:  The patient is nervous/anxious.   All other systems reviewed and are negative.  Physical Exam Updated Vital Signs BP (!) 183/109   Pulse 87   Temp 98.6 F (37 C) (Oral)   Resp 18   SpO2 97%   Physical Exam Vitals and nursing note reviewed.  Constitutional:      General: She is not in acute distress.    Appearance: She is well-developed.  HENT:     Head: Normocephalic and atraumatic.  Eyes:     Conjunctiva/sclera: Conjunctivae normal.  Cardiovascular:     Rate and Rhythm: Normal rate and regular rhythm.     Heart sounds: No murmur heard. Pulmonary:     Effort: Pulmonary effort is normal. No respiratory  distress.     Breath sounds: Normal breath sounds.  Abdominal:     Palpations: Abdomen is soft.     Tenderness: There is no abdominal tenderness.  Musculoskeletal:     Cervical back: Neck supple.  Skin:    General: Skin is warm and dry.  Neurological:     Mental Status: She is alert.    ED Results / Procedures / Treatments   Labs (all labs ordered are listed, but only abnormal results are displayed) Labs Reviewed  CBC WITH DIFFERENTIAL/PLATELET - Abnormal; Notable for the following components:      Result Value   RBC 5.35 (*)    All other components within normal limits  COMPREHENSIVE METABOLIC PANEL - Abnormal; Notable for the following components:   Glucose, Bld 114 (*)    Total Protein 8.3 (*)    All other components within normal limits  LIPASE, BLOOD  TROPONIN I (HIGH SENSITIVITY)  TROPONIN I (HIGH SENSITIVITY)    EKG None  Radiology DG Chest 2 View  Result Date: 04/03/2021 CLINICAL DATA:  Chest pain. EXAM: CHEST - 2 VIEW COMPARISON:  None. FINDINGS: The heart size and mediastinal contours are within normal limits. Both lungs are clear. The visualized skeletal structures are unremarkable. IMPRESSION: No active cardiopulmonary disease. Electronically Signed   By: Marijo Conception M.D.   On: 04/03/2021 08:42   CT Abdomen Pelvis W Contrast  Result Date: 04/03/2021 CLINICAL DATA:  Epigastric pain EXAM: CT ABDOMEN AND PELVIS WITH CONTRAST TECHNIQUE: Multidetector CT imaging of the abdomen and pelvis was performed using the standard protocol following bolus administration of intravenous contrast. CONTRAST:  71mL OMNIPAQUE IOHEXOL 350 MG/ML SOLN COMPARISON:  None. FINDINGS: Lower chest: No acute abnormality. Hepatobiliary: There are multiple tiny hypodense hepatic lesions, most prominent the left hepatic lobe, too small to characterize but statistically likely to be cysts. No gallstones, gallbladder wall thickening, or biliary dilatation. Pancreas: Unremarkable. No pancreatic  ductal dilatation or surrounding inflammatory changes. Spleen: Normal in size without focal abnormality. Adrenals/Urinary Tract: Adrenal glands are unremarkable. No hydronephrosis or nephrolithiasis. Tiny left renal cyst. The bladder is decompressed. Stomach/Bowel: The stomach is within normal limits. There is no evidence of bowel obstruction. The appendix is normal. Colonic diverticulosis. No evidence of acute diverticulitis. Vascular/Lymphatic: Aortoiliac atherosclerotic calcifications. No AAA. No enlarged abdominal or pelvic lymph nodes. Reproductive: Endometrial thickening. Fundal calcified uterine fibroid. Other: No abdominal wall hernia or abnormality. No abdominopelvic ascites. Musculoskeletal: No acute osseous abnormality. No suspicious lytic or blastic lesions. Multilevel degenerative changes of the spine, most severe at L4-L5 and L5-S1. IMPRESSION: No acute abdominopelvic abnormality.  Normal appendix. Colonic diverticulosis.  No evidence of acute  diverticulitis. Endometrial thickening, which is considered abnormal if postmenopausal. Non-emergent pelvic ultrasound is recommended. Multiple tiny hypodense hepatic lesions, too small to characterize but statistically likely to be cysts in the absence of a known malignancy. Electronically Signed   By: Maurine Simmering M.D.   On: 04/03/2021 11:53    Procedures Procedures   Medications Ordered in ED Medications  LORazepam (ATIVAN) tablet 0.5 mg (0.5 mg Oral Given 04/03/21 0821)  iohexol (OMNIPAQUE) 350 MG/ML injection 80 mL (80 mLs Intravenous Contrast Given 04/03/21 1129)    ED Course  I have reviewed the triage vital signs and the nursing notes.  Pertinent labs & imaging results that were available during my care of the patient were reviewed by me and considered in my medical decision making (see chart for details).    MDM Rules/Calculators/A&P                           Patient seen in the emergency department for evaluation of anxiety and  abdominal pain.  Patient received 0.5 mg of Ativan in the lobby and her anxiety attack had resolved prior to my evaluation.  Physical exam is unremarkable.  Laboratory evaluation unremarkable.  A CT abdomen pelvis was obtained in the setting of her abdominal pain that shows endometrial thickening and in the setting of a postmenopausal female, may indicate endometrial carcinoma.  Patient has had no pelvic pain or vaginal bleeding and that this can be further characterized in the outpatient setting.  I spoke with Dr. Ilda Basset of OB/GYN who will help coordinate follow-up and I sent a message to the patient's primary care physician to close the loop.  In regards the patient's anxiety, using shared decision-making, we agreed that the patient will have a very short prescription for Ativan to be used for panic attacks only and this will not be refilled from the emergency department.  She will follow-up with her primary care physician to discuss her anxiety further.  She denies suicidal ideation or homicidal ideation is safe for discharge at this time. Final Clinical Impression(s) / ED Diagnoses Final diagnoses:  None    Rx / DC Orders ED Discharge Orders     None        Saren Corkern, MD 04/03/21 1432

## 2021-04-03 NOTE — ED Provider Notes (Addendum)
Emergency Medicine Provider Triage Evaluation Note  Erin Williamson , a 60 y.o. female  was evaluated in triage.  Pt complains of epigastric pain. Began Friday. Located to center of epigastric region and generalized abd pain. Occasionally radiates into back however no current back pain. Similar episode in past and dx with GERD. Has not taken meds today. No SOB, cough, fever, emesis, numbness, weakness. No hx of AAA, dissection, PE, DVT. Desrcibed her pain as a burning sensation. States her anxiety has also been bad. Feels very anxious with medical facilities. Requesting Ativan or Klonopin for her sx has she states this has helped in past.   Review of Systems  Positive:  abd pain Negative: Fever, emesis, back pain, emesis  Physical Exam  BP (!) 181/105   Pulse 85   Temp 98.6 F (37 C) (Oral)   Resp 17   SpO2 98%  Gen:   Awake, no distress   Resp:  Normal effort  MSK:   Moves extremities without difficulty  Other:    Medical Decision Making  Medically screening exam initiated at 8:14 AM.  Appropriate orders placed.  Erin Williamson was informed that the remainder of the evaluation will be completed by another provider, this initial triage assessment does not replace that evaluation, and the importance of remaining in the ED until their evaluation is complete.  Abd pain Anxiety       Effrey Davidow A, PA-C 04/03/21 0816    Teressa Lower, MD 04/03/21 [phone]

## 2021-04-03 NOTE — ED Triage Notes (Signed)
Pt arrived via POV, c/o anxiety and burning in abd up to chest. States she is very anxious, has a hx of same. Home meds not working.

## 2021-04-04 ENCOUNTER — Encounter: Payer: Self-pay | Admitting: Family Medicine

## 2021-04-12 ENCOUNTER — Ambulatory Visit (INDEPENDENT_AMBULATORY_CARE_PROVIDER_SITE_OTHER): Payer: Managed Care, Other (non HMO) | Admitting: Family Medicine

## 2021-04-12 ENCOUNTER — Encounter: Payer: Self-pay | Admitting: Family Medicine

## 2021-04-12 VITALS — BP 124/88 | HR 88 | Temp 97.8°F | Resp 16 | Wt 181.2 lb

## 2021-04-12 DIAGNOSIS — F411 Generalized anxiety disorder: Secondary | ICD-10-CM | POA: Diagnosis not present

## 2021-04-12 MED ORDER — BUPROPION HCL 75 MG PO TABS: 75.0000 mg | ORAL_TABLET | Freq: Two times a day (BID) | ORAL | 3 refills | Status: DC

## 2021-04-12 MED ORDER — LORAZEPAM 1 MG PO TABS: 0.5000 mg | ORAL_TABLET | Freq: Three times a day (TID) | ORAL | 0 refills | Status: DC | PRN

## 2021-04-12 NOTE — Patient Instructions (Signed)
Follow up in 4-6 weeks to recheck anxiety DECREASE the Fluoxetine to 60mg  daily START the Bupropion 75mg  twice daily USE the Lorazepam as needed for panicked moments Call with any questions or concerns Hang in there!!!

## 2021-04-12 NOTE — Assessment & Plan Note (Signed)
Deteriorated.  Pt's anxiety is much worse despite increasing her Fluoxetine to 80mg  daily.  She did this on her own w/o consulting a medical provider.  This has not helped her anxiety and she ended up in the ER b/c of it.  There they incidentally found that her endometrial lining was thickened and referred her to GYN.  This has her even more anxious.  Will decrease fluoxetine back to 60mg  daily and add Wellbutrin 75mg  BID.  Will refill Lorazepam for her to have on hand for panic attacks.  Pt expressed understanding and is in agreement w/ plan.

## 2021-04-12 NOTE — Progress Notes (Signed)
   Subjective:    Patient ID: Erin Williamson, female    DOB: 05/29/1960, 60 y.o.   MRN: [id number]  HPI Anxiety- pt has hx of GAD and was seeing Dr Malachy Moan (psych) and Derinda Late (therapist) when in [place].  Went to ER on 11/8 for anxiety and b/c she was out of Alprazolam.  ER gave her Lorazepam and told her to f/u.  Pt reports she started 'waking up in panic' a 'couple of months ago'.  At that time she upped her fluoxetine dose to 80mg  daily.  Pt continues to feel 'nervous all the time'.     Review of Systems For ROS see HPI   This visit occurred during the SARS-CoV-2 public health emergency.  Safety protocols were in place, including screening questions prior to the visit, additional usage of staff PPE, and extensive cleaning of exam room while observing appropriate contact time as indicated for disinfecting solutions.      Objective:   Physical Exam Vitals reviewed.  Constitutional:      General: She is not in acute distress.    Appearance: Normal appearance. She is not ill-appearing.  HENT:     Head: Normocephalic and atraumatic.  Eyes:     Extraocular Movements: Extraocular movements intact.     Conjunctiva/sclera: Conjunctivae normal.     Pupils: Pupils are equal, round, and reactive to light.  Cardiovascular:     Rate and Rhythm: Normal rate.  Pulmonary:     Effort: Pulmonary effort is normal. No respiratory distress.  Skin:    General: Skin is warm and dry.  Neurological:     General: No focal deficit present.     Mental Status: She is alert and oriented to person, place, and time.  Psychiatric:     Comments: Anxious, almost trembling          Assessment & Plan:

## 2021-04-18 ENCOUNTER — Ambulatory Visit (INDEPENDENT_AMBULATORY_CARE_PROVIDER_SITE_OTHER): Payer: Managed Care, Other (non HMO) | Admitting: Obstetrics & Gynecology

## 2021-04-18 ENCOUNTER — Other Ambulatory Visit: Payer: Self-pay

## 2021-04-18 ENCOUNTER — Encounter: Payer: Self-pay | Admitting: Obstetrics & Gynecology

## 2021-04-18 VITALS — BP 182/88 | HR 75 | Ht 63.0 in | Wt 177.1 lb

## 2021-04-18 DIAGNOSIS — R9389 Abnormal findings on diagnostic imaging of other specified body structures: Secondary | ICD-10-CM | POA: Diagnosis not present

## 2021-04-18 NOTE — Progress Notes (Signed)
GYNECOLOGY OFFICE VISIT NOTE  History:   Erin Williamson is a 60 y.o. PMP female here today for evaluation of incidentally found endometrial thickening on CT scan.  Was being evaluated for epigastric pain, and there was mention of endometrial thickening.  Postmenopausal since 8 years ago, no bleeding, no lower abdominal pain. She is very anxious, thinks she has cancer. Accompanied by her sister who is working hard to calm her down.  Significant history of extreme anxiety. She denies any abnormal vaginal discharge, bleeding, pelvic pain or other concerns.    Past Medical History:  Diagnosis Date   Arthritis    GERD (gastroesophageal reflux disease)    Hyperlipidemia    Hypertension    Migraine     Past Surgical History:  Procedure Laterality Date   CESAREAN SECTION     1987 and 197   tummy tuck      The following portions of the patient's history were reviewed and updated as appropriate: allergies, current medications, past family history, past medical history, past social history, past surgical history and problem list.   Health Maintenance:  Normal pap many years ago, declined paps recently.  Normal mammogram on 04/24/2020.   Review of Systems:  Pertinent items noted in HPI and remainder of comprehensive ROS otherwise negative.  Physical Exam:  BP (!) 182/88   Pulse 75   Ht 5\' 3"  (1.6 m)   Wt 177 lb 1.6 oz (80.3 kg)   BMI 31.37 kg/m  CONSTITUTIONAL: Well-developed, well-nourished female in no acute distress.  HEENT:  Normocephalic, atraumatic. External right and left ear normal. No scleral icterus.  NECK: Normal range of motion, supple, no masses noted on observation SKIN: No rash noted. Not diaphoretic. No erythema. No pallor. MUSCULOSKELETAL: Normal range of motion. No edema noted. NEUROLOGIC: Alert and oriented to person, place, and time. Normal muscle tone coordination. No cranial nerve deficit noted. PSYCHIATRIC: Normal mood and affect. Normal behavior. Normal  judgment and thought content. CARDIOVASCULAR: Normal heart rate noted RESPIRATORY: Effort and breath sounds normal, no problems with respiration noted ABDOMEN: No masses noted. No other overt distention noted.   PELVIC: Deferred  Labs and Imaging DG Chest 2 View  Result Date: 04/03/2021 CLINICAL DATA:  Chest pain. EXAM: CHEST - 2 VIEW COMPARISON:  None. FINDINGS: The heart size and mediastinal contours are within normal limits. Both lungs are clear. The visualized skeletal structures are unremarkable. IMPRESSION: No active cardiopulmonary disease. Electronically Signed   By: Marijo Conception M.D.   On: 04/03/2021 08:42   CT Abdomen Pelvis W Contrast  Result Date: 04/03/2021 CLINICAL DATA:  Epigastric pain EXAM: CT ABDOMEN AND PELVIS WITH CONTRAST TECHNIQUE: Multidetector CT imaging of the abdomen and pelvis was performed using the standard protocol following bolus administration of intravenous contrast. CONTRAST:  86mL OMNIPAQUE IOHEXOL 350 MG/ML SOLN COMPARISON:  None. FINDINGS: Lower chest: No acute abnormality. Hepatobiliary: There are multiple tiny hypodense hepatic lesions, most prominent the left hepatic lobe, too small to characterize but statistically likely to be cysts. No gallstones, gallbladder wall thickening, or biliary dilatation. Pancreas: Unremarkable. No pancreatic ductal dilatation or surrounding inflammatory changes. Spleen: Normal in size without focal abnormality. Adrenals/Urinary Tract: Adrenal glands are unremarkable. No hydronephrosis or nephrolithiasis. Tiny left renal cyst. The bladder is decompressed. Stomach/Bowel: The stomach is within normal limits. There is no evidence of bowel obstruction. The appendix is normal. Colonic diverticulosis. No evidence of acute diverticulitis. Vascular/Lymphatic: Aortoiliac atherosclerotic calcifications. No AAA. No enlarged abdominal or pelvic lymph nodes. Reproductive:  Endometrial thickening. Fundal calcified uterine fibroid. Other: No  abdominal wall hernia or abnormality. No abdominopelvic ascites. Musculoskeletal: No acute osseous abnormality. No suspicious lytic or blastic lesions. Multilevel degenerative changes of the spine, most severe at L4-L5 and L5-S1. IMPRESSION: No acute abdominopelvic abnormality.  Normal appendix. Colonic diverticulosis.  No evidence of acute diverticulitis. Endometrial thickening, which is considered abnormal if postmenopausal. Non-emergent pelvic ultrasound is recommended. Multiple tiny hypodense hepatic lesions, too small to characterize but statistically likely to be cysts in the absence of a known malignancy. Electronically Signed   By: Maurine Simmering M.D.   On: 04/03/2021 11:53      Assessment and Plan:    1. Endometrial thickening on CT scan Reviewed CT scan in detail. Reassured by absence of bleeding on other concerning symptoms. Ultrasound recommended for further characterization of thickening. If needed, endometrial biopsy vs Hysteroscopy/D&C will be recommended, information given to her to review at home about these.  Reassured her that this is very likely benign; even in the case of postmenopausal bleeding 85-90% of etiologies are benign.  Advised to take anxiolytics and Ibuprofen prior to next appointment if needed. - US PELVIC COMPLETE WITH TRANSVAGINAL; Future Routine preventative health maintenance measures emphasized, pap smear will be done if endometrial biopsy or other procedure is done. Please refer to After Visit Summary for other counseling recommendations.   Return in 2 weeks (on 05/02/2021) for follow up ultrasound results, possible endometrial biopsy.    I spent 25 minutes dedicated to the care of this patient including pre-visit review of records, face to face time with the patient discussing her conditions and treatments and post visit orders.    Verita Schneiders, MD, [place] [facility] [employer] [facility], [place]

## 2021-04-18 NOTE — Patient Instructions (Signed)
Endometrial Biopsy An endometrial biopsy is a procedure to remove tissue samples from the endometrium, which is the lining of the uterus. The tissue that is removed can then be checked under a microscope for disease. This procedure is used to diagnose conditions such as endometrial cancer, endometrial tuberculosis, polyps, or other inflammatory conditions. This procedure may also be used to investigate uterine bleeding to determine where you are in your menstrual cycle or how your hormone levels are affecting the lining of the uterus. Tell a health care provider about: Any allergies you have. All medicines you are taking, including vitamins, herbs, eye drops, creams, and over-the-counter medicines. Any problems you or family members have had with anesthetic medicines. Any blood disorders you have. Any surgeries you have had. Any medical conditions you have. Whether you are pregnant or may be pregnant. What are the risks? Generally, this is a safe procedure. However, problems may occur, including: Bleeding. Pelvic infection. Puncture of the wall of the uterus with the biopsy device (rare). Allergic reactions to medicines. What happens before the procedure? Keep a record of your menstrual cycles as told by your health care provider. You may need to schedule your procedure for a specific time in your cycle. You may want to bring a sanitary pad to wear after the procedure. Plan to have someone take you home from the hospital or clinic. Ask your health care provider about: Changing or stopping your regular medicines. This is especially important if you are taking diabetes medicines, arthritis medicines, or blood thinners. Taking medicines such as aspirin and ibuprofen. These medicines can thin your blood. Do not take these medicines unless your health care provider tells you to take them. Taking over-the-counter medicines, vitamins, herbs, and supplements. What happens during the procedure? You  will lie on an exam table with your feet and legs supported as in a pelvic exam. Your health care provider will insert an instrument (speculum) into your vagina to see your cervix. Your cervix will be cleansed with an antiseptic solution. A medicine (local anesthetic) will be used to numb the cervix. A forceps instrument (tenaculum) will be used to hold your cervix steady for the biopsy. A thin, rod-like instrument (uterine sound) will be inserted through your cervix to determine the length of your uterus and the location where the biopsy sample will be removed. A thin, flexible tube (catheter) will be inserted through your cervix and into the uterus. The catheter will be used to collect the biopsy sample from your endometrial tissue. The catheter and speculum will then be removed, and the tissue sample will be sent to a lab for examination. The procedure may vary among health care providers and hospitals. What can I expect after procedure? You will rest in a recovery area until you are ready to go home. You may have mild cramping and a small amount of vaginal bleeding. This is normal. You may have a small amount of vaginal bleeding for a few days. This is normal. It is up to you to get the results of your procedure. Ask your health care provider, or the department that is doing the procedure, when your results will be ready. Follow these instructions at home: Take over-the-counter and prescription medicines only as told by your health care provider. Do not douche, use tampons, or have sexual intercourse until your health care provider approves. Return to your normal activities as told by your health care provider. Ask your health care provider what activities are safe for you. Follow instructions   from your health care provider about any activity restrictions, such as restrictions on strenuous exercise or heavy lifting. Keep all follow-up visits. This is important. Contact a health care  provider: You have heavy bleeding, or bleed for longer than 2 days after the procedure. You have bad smelling discharge from your vagina. You have a fever or chills. You have a burning sensation when urinating or you have difficulty urinating. You have severe pain in your lower abdomen. Get help right away if you: You have severe cramps in your stomach or back. You pass large blood clots. Your bleeding increases. You become weak or light-headed, or you faint or lose consciousness. Summary An endometrial biopsy is a procedure to remove tissue samples is taken from the endometrium, which is the lining of the uterus. The tissue sample that is removed will be checked under a microscope for disease. This procedure is used to diagnose conditions such as endometrial cancer, endometrial tuberculosis, polyps, or other inflammatory conditions. After the procedure, it is common to have mild cramping and a small amount of vaginal bleeding for a few days. Do not douche, use tampons, or have sexual intercourse until your health care provider approves. Ask your health care provider which activities are safe for you. This information is not intended to replace advice given to you by your health care provider. Make sure you discuss any questions you have with your health care provider. Document Revised: 01/24/2021 Document Reviewed: 12/06/2019 Elsevier Patient Education  2022 [employer].  Hysteroscopy Hysteroscopy is a procedure used to look inside a woman's womb (uterus). This may be done for various reasons, including: To look for tumors and other growths in the uterus. To evaluate abnormal bleeding, fibroid tumors, polyps, scar tissue, or uterine cancer. To determine why a woman is unable to get pregnant or has had repeated pregnancy losses. To locate an IUD (intrauterine device). To place a birth control device into the fallopian tubes. During this procedure, a thin, flexible tube with a small  light and camera (hysteroscope) is used to examine the uterus. The camera sends images to a monitor in the room so that your health care provider can view the inside of your uterus. A hysteroscopy should be done right after a menstrual period. Tell a health care provider about: Any allergies you have. All medicines you are taking, including vitamins, herbs, eye drops, creams, and over-the-counter medicines. Any problems you or family members have had with anesthetic medicines. Any blood disorders you have. Any surgeries you have had. Any medical conditions you have. Whether you are pregnant or may be pregnant. Whether you have been diagnosed with an STI (sexually transmitted infection) or you think you have an STI. What are the risks? Generally, this is a safe procedure. However, problems may occur, including: Excessive bleeding. Infection. Damage to the uterus or other structures or organs. Allergic reaction to medicines or fluids that are used in the procedure. What happens before the procedure? Staying hydrated Follow instructions from your health care provider about hydration, which may include: Up to 2 hours before the procedure - you may continue to drink clear liquids, such as water, clear fruit juice, black coffee, and plain tea. Eating and drinking restrictions Follow instructions from your health care provider about eating and drinking, which may include: 8 hours before the procedure - stop eating solid foods and drink clear liquids only. 2 hours before the procedure - stop drinking clear liquids. Medicines Ask your health care provider about: Changing or stopping  your regular medicines. This is especially important if you are taking diabetes medicines or blood thinners. Taking medicines such as aspirin and ibuprofen. These medicines can thin your blood. Do not take these medicines unless your health care provider tells you to take them. Taking over-the-counter medicines,  vitamins, herbs, and supplements. Medicine may be placed in your cervix the day before the procedure. This medicine causes the cervix to open (dilate). The larger opening makes it easier for the hysteroscope to be inserted into the uterus during the procedure. General instructions Ask your health care provider: What steps will be taken to help prevent infection. These steps may include: Washing skin with a germ-killing soap. Taking antibiotic medicine. Do not use any products that contain nicotine or tobacco for at least 4 weeks before the procedure. These products include cigarettes, chewing tobacco, and vaping devices, such as e-cigarettes. If you need help quitting, ask your health care provider. Plan to have a responsible adult take you home from the hospital or clinic. Plan to have a responsible adult care for you for the time you are told after you leave the hospital or clinic. This is important. Empty your bladder before the procedure begins. What happens during the procedure? An IV will be inserted into one of your veins. You may be given: A medicine to help you relax (sedative). A medicine that numbs the area around the cervix (local anesthetic). A medicine to make you fall asleep (general anesthetic). A hysteroscope will be inserted through your vagina and into your uterus. Air or fluid will be used to enlarge your uterus to allow your health care provider to see it better. The amount of fluid used will be carefully checked throughout the procedure. In some cases, tissue may be gently scraped from inside the uterus and sent to a lab for testing (biopsy). The procedure may vary among health care providers and hospitals. What can I expect after the procedure? Your blood pressure, heart rate, breathing rate, and blood oxygen level will be monitored until you leave the hospital or clinic. You may have cramps. You may be given medicines for this. You may have bleeding, which may vary  from light spotting to menstrual-like bleeding. This is normal. If you had a biopsy, it is up to you to get the results. Ask your health care provider, or the department that is doing the procedure, when your results will be ready. Follow these instructions at home: Activity Rest as told by your health care provider. Return to your normal activities as told by your health care provider. Ask your health care provider what activities are safe for you. If you were given a sedative during the procedure, it can affect you for several hours. Do not drive or operate machinery until your health care provider says that it is safe. Medicines Do not take aspirin or other NSAIDs during recovery, as told by your healthcare provider. It can increase the risk of bleeding. Ask your health care provider if the medicine prescribed to you: Requires you to avoid driving or using machinery. Can cause constipation. You may need to take these actions to prevent or treat constipation: Drink enough fluid to keep your urine pale yellow. Take over-the-counter or prescription medicines. Eat foods that are high in fiber, such as beans, whole grains, and fresh fruits and vegetables. Limit foods that are high in fat and processed sugars, such as fried or sweet foods. General instructions Do not douche, use tampons, or have sex for 2 weeks  after the procedure, or until your health care provider approves. Do not take baths, swim, or use a hot tub until your health care provider approves. Take showers instead of baths for 2 weeks, or for as long as told by your health care provider. Keep all follow-up visits. This is important. Contact a health care provider if: You feel dizzy or lightheaded. You feel nauseous. You have abnormal vaginal discharge. You have a rash. You have pain that does not get better with medicine. You have chills. Get help right away if: You have bleeding that is heavier than a normal menstrual  period. You have a fever. You have pain or cramps that get worse. You develop new abdominal pain. You faint. You have pain in your shoulder. You are short of breath. Summary Hysteroscopy is a procedure that is used to look inside a woman's womb (uterus). After the procedure, you may have bleeding, which varies from light spotting to menstrual-like bleeding. This is normal. You may also have cramps. Do not douche, use tampons, or have sex for 2 weeks after the procedure, or until your health care provider approves. Plan to have a responsible adult take you home from the hospital or clinic. This information is not intended to replace advice given to you by your health care provider. Make sure you discuss any questions you have with your health care provider. Document Revised: 01/24/2021 Document Reviewed: 12/29/2019 Elsevier Patient Education  2022 [employer].

## 2021-04-25 ENCOUNTER — Other Ambulatory Visit: Payer: Self-pay

## 2021-04-25 ENCOUNTER — Ambulatory Visit
Admission: RE | Admit: 2021-04-25 | Discharge: 2021-04-25 | Disposition: A | Discharge status: Home / Self Care | Payer: Managed Care, Other (non HMO) | Source: Ambulatory Visit | Attending: Obstetrics & Gynecology | Admitting: Obstetrics & Gynecology

## 2021-04-25 DIAGNOSIS — R9389 Abnormal findings on diagnostic imaging of other specified body structures: Secondary | ICD-10-CM | POA: Diagnosis present

## 2021-04-26 ENCOUNTER — Telehealth: Payer: Self-pay | Admitting: *Deleted

## 2021-04-26 ENCOUNTER — Encounter: Payer: Self-pay | Admitting: Obstetrics & Gynecology

## 2021-04-26 DIAGNOSIS — R9389 Abnormal findings on diagnostic imaging of other specified body structures: Secondary | ICD-10-CM | POA: Insufficient documentation

## 2021-04-26 NOTE — Telephone Encounter (Signed)
I called Erin Williamson and informed her of results and recommendations per Dr. Harolyn Rutherford and that she recommends [name] take her anxiety med and iburprofen prior to appointment. I reviewed her appointment date/time with her. She voices understanding. Juliana Boling,RN

## 2021-04-26 NOTE — Telephone Encounter (Signed)
-----   Message from Osborne Oman, MD sent at 04/26/2021  8:08 AM EST ----- Ultrasound endometrial thickening is very concerning. Endometrial biopsy is recommended.  Patient is very, very anxious...please tell her to take anxiety medication and Ibuprofen prior to her appointment. Please call to inform patient of results and recommendations.

## 2021-05-03 ENCOUNTER — Encounter: Payer: Self-pay | Admitting: Family Medicine

## 2021-05-04 ENCOUNTER — Other Ambulatory Visit: Payer: Self-pay | Admitting: Family Medicine

## 2021-05-11 ENCOUNTER — Encounter: Payer: Self-pay | Admitting: Family Medicine

## 2021-05-11 ENCOUNTER — Other Ambulatory Visit (HOSPITAL_COMMUNITY)
Admission: RE | Admit: 2021-05-11 | Discharge: 2021-05-11 | Disposition: A | Discharge status: Home / Self Care | Payer: Managed Care, Other (non HMO) | Source: Ambulatory Visit | Attending: Family Medicine | Admitting: Family Medicine

## 2021-05-11 ENCOUNTER — Ambulatory Visit (INDEPENDENT_AMBULATORY_CARE_PROVIDER_SITE_OTHER): Payer: Managed Care, Other (non HMO) | Admitting: Family Medicine

## 2021-05-11 ENCOUNTER — Other Ambulatory Visit: Payer: Self-pay

## 2021-05-11 VITALS — BP 163/92 | HR 76 | Wt 179.0 lb

## 2021-05-11 DIAGNOSIS — Z124 Encounter for screening for malignant neoplasm of cervix: Secondary | ICD-10-CM

## 2021-05-11 DIAGNOSIS — N840 Polyp of corpus uteri: Secondary | ICD-10-CM | POA: Diagnosis not present

## 2021-05-11 DIAGNOSIS — Z3202 Encounter for pregnancy test, result negative: Secondary | ICD-10-CM

## 2021-05-11 DIAGNOSIS — R9389 Abnormal findings on diagnostic imaging of other specified body structures: Secondary | ICD-10-CM | POA: Insufficient documentation

## 2021-05-11 LAB — POCT PREGNANCY, URINE: Preg Test, Ur: NEGATIVE

## 2021-05-14 ENCOUNTER — Encounter: Payer: Self-pay | Admitting: Family Medicine

## 2021-05-14 LAB — SURGICAL PATHOLOGY

## 2021-05-14 NOTE — Assessment & Plan Note (Signed)
S/p biopsy--await results

## 2021-05-14 NOTE — Progress Notes (Signed)
Subjective:    Patient ID: Erin Williamson is a 60 y.o. female presenting with Endometrial Biopsy  on 05/11/2021  HPI: Here for attempt at EMB. Has h/o thickened endometrium on Ct when seen for another indication. Menopause at age 69. H/o c-section x 2. Seen before and sent fo u/s which confirmed thickened endometrium. No h/o PMB and reports no bleeding at all.   Review of Systems  Constitutional:  Negative for chills and fever.  Respiratory:  Negative for shortness of breath.   Cardiovascular:  Negative for chest pain.  Gastrointestinal:  Negative for abdominal pain, nausea and vomiting.  Genitourinary:  Negative for dysuria.  Skin:  Negative for rash.     Objective:    BP (!) 163/92    Pulse 76    Wt 179 lb (81.2 kg)    BMI 31.71 kg/m  Physical Exam Constitutional:      General: She is not in acute distress.    Appearance: She is well-developed.  HENT:     Head: Normocephalic and atraumatic.  Eyes:     General: No scleral icterus. Cardiovascular:     Rate and Rhythm: Normal rate.  Pulmonary:     Effort: Pulmonary effort is normal.  Abdominal:     Palpations: Abdomen is soft.  Genitourinary:    Comments: BUS normal, vagina is pink and rugated, cervix is nulliparous without lesion  Musculoskeletal:     Cervical back: Neck supple.  Skin:    General: Skin is warm and dry.  Neurological:     Mental Status: She is alert and oriented to person, place, and time.   US PELVIC COMPLETE WITH TRANSVAGINAL  Result Date: 04/25/2021 CLINICAL DATA:  Endometrial thickening seen on CT EXAM: TRANSABDOMINAL AND TRANSVAGINAL ULTRASOUND OF PELVIS TECHNIQUE: Both transabdominal and transvaginal ultrasound examinations of the pelvis were performed. Transabdominal technique was performed for global imaging of the pelvis including uterus, ovaries, adnexal regions, and pelvic cul-de-sac. It was necessary to proceed with endovaginal exam following the transabdominal exam to visualize the  endometrium. COMPARISON:  CT abdomen pelvis 04/03/2021 FINDINGS: Uterus Measurements: 7.1 x 3.4 x 5.0 cm = volume: 63 mL. Small calcified fibroid seen in the right uterine fundus measuring 1.4 x 1.4 x 1.3 cm. Endometrium Thickness: 12 mm.  Diffusely heterogeneous and thickened. Right ovary Not visualized. Left ovary Not visualized. Other findings No abnormal free fluid. IMPRESSION: Abnormally thickened heterogeneous endometrium measuring up to 12 mm. Findings are suspicious for uterine malignancy. Further evaluation with tissue sampling should be considered. Electronically Signed   By: Miachel Roux M.D.   On: 04/25/2021 15:59    Procedure: Patient given informed consent, signed copy in the chart, time out was performed. Appropriate time out taken. The patient was placed in the lithotomy position and the cervix brought into view with sterile speculum.  Portio of cervix cleansed x 2 with betadine swabs.  A tenaculum was placed in the anterior lip of the cervix. An os finder ws used.  The uterus was sounded for depth of 7 cm. A pipelle was introduced to into the uterus, suction created,  and an endometrial sample was obtained. Very small amount of tissue.  The patient tolerated the procedure well.       Assessment & Plan:   Problem List Items Addressed This Visit       Unprioritized   Endometrial thickening on ultrasound - Primary    S/p biopsy--await results      Relevant Orders   Surgical pathology(  [facility]/ [facility])   Other Visit Diagnoses     Pap smear for cervical cancer screening       Relevant Orders   Cytology - PAP( [place])       Return if symptoms worsen or fail to improve.  Donnamae Jude 05/11/2020 11:39 AM

## 2021-05-16 LAB — CYTOLOGY - PAP
Comment: NEGATIVE
Diagnosis: NEGATIVE
High risk HPV: NEGATIVE

## 2021-05-22 ENCOUNTER — Ambulatory Visit (INDEPENDENT_AMBULATORY_CARE_PROVIDER_SITE_OTHER): Payer: Managed Care, Other (non HMO) | Admitting: Family Medicine

## 2021-05-22 ENCOUNTER — Encounter: Payer: Self-pay | Admitting: Family Medicine

## 2021-05-22 VITALS — BP 150/82 | HR 78 | Temp 98.4°F | Resp 17 | Wt 185.6 lb

## 2021-05-22 DIAGNOSIS — B9689 Other specified bacterial agents as the cause of diseases classified elsewhere: Secondary | ICD-10-CM | POA: Diagnosis not present

## 2021-05-22 DIAGNOSIS — I10 Essential (primary) hypertension: Secondary | ICD-10-CM | POA: Insufficient documentation

## 2021-05-22 DIAGNOSIS — F411 Generalized anxiety disorder: Secondary | ICD-10-CM | POA: Diagnosis not present

## 2021-05-22 DIAGNOSIS — R030 Elevated blood-pressure reading, without diagnosis of hypertension: Secondary | ICD-10-CM | POA: Diagnosis not present

## 2021-05-22 DIAGNOSIS — J329 Chronic sinusitis, unspecified: Secondary | ICD-10-CM | POA: Diagnosis not present

## 2021-05-22 MED ORDER — LORAZEPAM 1 MG PO TABS: 0.5000 mg | ORAL_TABLET | Freq: Three times a day (TID) | ORAL | 0 refills | Status: DC | PRN

## 2021-05-22 MED ORDER — AMOXICILLIN 875 MG PO TABS: 875.0000 mg | ORAL_TABLET | Freq: Two times a day (BID) | ORAL | 0 refills | Status: AC

## 2021-05-22 NOTE — Patient Instructions (Signed)
Schedule your complete physical for early February START the Amoxicillin twice daily- w/ food- for the sinus infection Drink LOTS of fluids REST! Mucinex/Delsym/Robitussin as needed Call with any questions or concerns Hang in there! Happy New Year!

## 2021-05-22 NOTE — Progress Notes (Signed)
Subjective:    Patient ID: Erin Williamson, female    DOB: 07-21-1960, 60 y.o.   MRN: [id number]  HPI Anxiety- at last visit, we reduced her Fluoxetine to 60mg  daily and added Wellbutrin 75mg  BID.  Pt is not taking the Wellbutrin.  'it made me so sick I couldn't work'.  Pt reports that the Fluoxetine and the Ativan are working for the time being.    Elevated BP- today's BP is 150/82.  BP at GYN on 12/16 was 163/92 (but this was for a procedure).  Has been taking OTC decongestants.  No CP, SOB, visual changes, edema.  'i've been miserable this whole week'- left work feeling poorly on Tuesday.  Went to UC on Wednesday.  Had negative flu and COVID.  Tm 100.4.  at the time complained of sore throat but did not have a strep test done.  Now having facial/nasal congestion.  + HA.  + frontal sinus pain.  No tooth pain.   Review of Systems For ROS see HPI   This visit occurred during the SARS-CoV-2 public health emergency.  Safety protocols were in place, including screening questions prior to the visit, additional usage of staff PPE, and extensive cleaning of exam room while observing appropriate contact time as indicated for disinfecting solutions.      Objective:   Physical Exam Constitutional:      General: She is not in acute distress.    Appearance: Normal appearance. She is well-developed. She is not ill-appearing.  HENT:     Head: Normocephalic and atraumatic.     Right Ear: Tympanic membrane normal.     Left Ear: Tympanic membrane normal.     Nose: Mucosal edema and rhinorrhea present.     Right Sinus: Maxillary sinus tenderness and frontal sinus tenderness present.     Left Sinus: Maxillary sinus tenderness and frontal sinus tenderness present.     Mouth/Throat:     Pharynx: Uvula midline. Posterior oropharyngeal erythema present. No oropharyngeal exudate.  Eyes:     Conjunctiva/sclera: Conjunctivae normal.     Pupils: Pupils are equal, round, and reactive to light.   Cardiovascular:     Rate and Rhythm: Normal rate and regular rhythm.     Heart sounds: Normal heart sounds.  Pulmonary:     Effort: Pulmonary effort is normal. No respiratory distress.     Breath sounds: Normal breath sounds. No wheezing.  Musculoskeletal:     Cervical back: Normal range of motion and neck supple.     Right lower leg: No edema.     Left lower leg: No edema.  Lymphadenopathy:     Cervical: No cervical adenopathy.  Skin:    General: Skin is warm and dry.  Neurological:     General: No focal deficit present.     Mental Status: She is alert and oriented to person, place, and time.     Cranial Nerves: No cranial nerve deficit.     Motor: No weakness.     Coordination: Coordination normal.  Psychiatric:        Mood and Affect: Mood normal.        Behavior: Behavior normal.        Thought Content: Thought content normal.          Assessment & Plan:  Sinusitis- new.  Pt's sxs and duration of illness are consistent w/ bacterial infxn.  Start Amoxicillin.  Reviewed supportive care and red flags that should prompt return.  Pt expressed understanding and  is in agreement w/ plan.

## 2021-05-22 NOTE — Assessment & Plan Note (Signed)
Pt reports the Wellbutrin made her sick and she was not able to take it.  Thankfully, her situational anxiety has resolved as her GYN workup was benign.  She reports sxs are currently well controlled on Fluoxetine and Ativan.  Refill provided on Ativan.  Will follow.

## 2021-05-22 NOTE — Assessment & Plan Note (Signed)
New.  Pt's BP was WNL at last visit w/ me in November.  BP was elevated at GYN in December but she was having a procedure and was very anxious.  Today's BP is elevated but she has been taking decongestants for her sinus sxs.  Will assess BP when she returns in ~1 month for her CPE and determine if meds are needed at that time.  Pt expressed understanding and is in agreement w/ plan.

## 2021-07-06 ENCOUNTER — Encounter: Payer: Self-pay | Admitting: Family Medicine

## 2021-07-06 ENCOUNTER — Ambulatory Visit (INDEPENDENT_AMBULATORY_CARE_PROVIDER_SITE_OTHER): Payer: 59 | Admitting: Family Medicine

## 2021-07-06 VITALS — BP 142/90 | HR 67 | Temp 98.2°F | Resp 16 | Ht 63.0 in | Wt 186.0 lb

## 2021-07-06 DIAGNOSIS — E669 Obesity, unspecified: Secondary | ICD-10-CM | POA: Diagnosis not present

## 2021-07-06 DIAGNOSIS — Z23 Encounter for immunization: Secondary | ICD-10-CM | POA: Diagnosis not present

## 2021-07-06 DIAGNOSIS — E559 Vitamin D deficiency, unspecified: Secondary | ICD-10-CM

## 2021-07-06 DIAGNOSIS — Z1159 Encounter for screening for other viral diseases: Secondary | ICD-10-CM

## 2021-07-06 DIAGNOSIS — Z114 Encounter for screening for human immunodeficiency virus [HIV]: Secondary | ICD-10-CM

## 2021-07-06 DIAGNOSIS — Z0000 Encounter for general adult medical examination without abnormal findings: Secondary | ICD-10-CM

## 2021-07-06 DIAGNOSIS — Z800 Family history of malignant neoplasm of digestive organs: Secondary | ICD-10-CM

## 2021-07-06 DIAGNOSIS — R030 Elevated blood-pressure reading, without diagnosis of hypertension: Secondary | ICD-10-CM

## 2021-07-06 LAB — CBC WITH DIFFERENTIAL/PLATELET
Basophils Absolute: 0 10*3/uL (ref 0.0–0.1)
Basophils Relative: 0.4 % (ref 0.0–3.0)
Eosinophils Absolute: 0.1 10*3/uL (ref 0.0–0.7)
Eosinophils Relative: 1.3 % (ref 0.0–5.0)
HCT: 40.5 % (ref 36.0–46.0)
Hemoglobin: 13.4 g/dL (ref 12.0–15.0)
Lymphocytes Relative: 36.6 % (ref 12.0–46.0)
Lymphs Abs: 2.2 10*3/uL (ref 0.7–4.0)
MCHC: 33.1 g/dL (ref 30.0–36.0)
MCV: 82.2 fl (ref 78.0–100.0)
Monocytes Absolute: 0.4 10*3/uL (ref 0.1–1.0)
Monocytes Relative: 7 % (ref 3.0–12.0)
Neutro Abs: 3.3 10*3/uL (ref 1.4–7.7)
Neutrophils Relative %: 54.7 % (ref 43.0–77.0)
Platelets: 247 10*3/uL (ref 150.0–400.0)
RBC: 4.93 Mil/uL (ref 3.87–5.11)
RDW: 14.6 % (ref 11.5–15.5)
WBC: 6 10*3/uL (ref 4.0–10.5)

## 2021-07-06 LAB — BASIC METABOLIC PANEL
BUN: 14 mg/dL (ref 6–23)
CO2: 33 mEq/L — ABNORMAL HIGH (ref 19–32)
Calcium: 10.1 mg/dL (ref 8.4–10.5)
Chloride: 100 mEq/L (ref 96–112)
Creatinine, Ser: 0.63 mg/dL (ref 0.40–1.20)
GFR: 94.92 mL/min (ref >60.00)
Glucose, Bld: 73 mg/dL (ref 70–99)
Potassium: 4.1 mEq/L (ref 3.5–5.1)
Sodium: 139 mEq/L (ref 135–145)

## 2021-07-06 LAB — LIPID PANEL
Cholesterol: 225 mg/dL — ABNORMAL HIGH (ref 0–200)
HDL: 58.8 mg/dL (ref >39.00)
LDL Cholesterol: 137 mg/dL — ABNORMAL HIGH (ref 0–99)
NonHDL: 166.68
Total CHOL/HDL Ratio: 4
Triglycerides: 150 mg/dL — ABNORMAL HIGH (ref 0.0–149.0)
VLDL: 30 mg/dL (ref 0.0–40.0)

## 2021-07-06 LAB — HEPATIC FUNCTION PANEL
ALT: 20 U/L (ref 0–35)
AST: 21 U/L (ref 0–37)
Albumin: 4.6 g/dL (ref 3.5–5.2)
Alkaline Phosphatase: 75 U/L (ref 39–117)
Bilirubin, Direct: 0.1 mg/dL (ref 0.0–0.3)
Total Bilirubin: 0.8 mg/dL (ref 0.2–1.2)
Total Protein: 7.3 g/dL (ref 6.0–8.3)

## 2021-07-06 LAB — VITAMIN D 25 HYDROXY (VIT D DEFICIENCY, FRACTURES): VITD: 32.4 ng/mL (ref 30.00–100.00)

## 2021-07-06 LAB — TSH: TSH: 2.05 u[IU]/mL (ref 0.35–5.50)

## 2021-07-06 NOTE — Assessment & Plan Note (Signed)
BP remains elevated today even on recheck.  Pt to return in 3 months and if still elevated will start BP meds.

## 2021-07-06 NOTE — Assessment & Plan Note (Signed)
Pt's BMI 32.95  Encouraged healthy diet and regular exercise.  Check labs to risk stratify.  Will follow.

## 2021-07-06 NOTE — Patient Instructions (Signed)
Schedule a follow up in 3 months to recheck BP and get 2nd shingles shot We'll notify you of your lab results and make any changes if needed Continue to work on healthy diet and regular exercise- you can do it! We'll call you with your GI appt Call with any questions or concerns Stay Safe!  Stay Healthy! Happy Valentine's Day!!!

## 2021-07-06 NOTE — Assessment & Plan Note (Signed)
Pt's PE WNL w/ exception of obesity.  UTD on mammo, pap, colonoscopy, Tdap.  Check labs.  Anticipatory guidance provided.

## 2021-07-06 NOTE — Assessment & Plan Note (Signed)
Check labs and replete prn. 

## 2021-07-06 NOTE — Progress Notes (Signed)
° °  Subjective:    Patient ID: Erin Williamson, female    DOB: 1960-10-17, 61 y.o.   MRN: [id number]  HPI CPE- UTD on mammo, pap, colonoscopy, Tdap.  Health Maintenance  Topic Date Due   HIV Screening  Never done   Hepatitis C Screening  Never done   Zoster Vaccines- Shingrix (1 of 2) Never done   INFLUENZA VACCINE  08/24/2021 (Originally 12/25/2020)   MAMMOGRAM  04/24/2022   PAP SMEAR-Modifier  05/11/2024   COLONOSCOPY (Pts 45-20yrs Insurance coverage will need to be confirmed)  12/20/2025   TETANUS/TDAP  02/10/2031   HPV VACCINES  Aged Out   COVID-19 Vaccine  Discontinued     Review of Systems Patient reports no vision/ hearing changes, adenopathy,fever, weight change,  persistant/recurrent hoarseness , swallowing issues, chest pain, palpitations, edema, persistant/recurrent cough, hemoptysis, gastrointestinal bleeding (melena, rectal bleeding), abdominal pain, significant heartburn, bowel changes, GU symptoms (dysuria, hematuria, incontinence), Gyn symptoms (abnormal  bleeding, pain),  syncope, focal weakness, memory loss, numbness & tingling, skin/hair/nail changes, abnormal bruising or bleeding, anxiety, or depression.   + SOB w/ exertion- feels this is due to deconditioning  This visit occurred during the SARS-CoV-2 public health emergency.  Safety protocols were in place, including screening questions prior to the visit, additional usage of staff PPE, and extensive cleaning of exam room while observing appropriate contact time as indicated for disinfecting solutions.      Objective:   Physical Exam General Appearance:    Alert, cooperative, no distress, appears stated age  Head:    Normocephalic, without obvious abnormality, atraumatic  Eyes:    PERRL, conjunctiva/corneas clear, EOM's intact, fundi    benign, both eyes  Ears:    Normal TM's and external ear canals, both ears  Nose:   Deferred due to COVID  Throat:   Neck:   Supple, symmetrical, trachea midline, no adenopathy;     Thyroid: no enlargement/tenderness/nodules  Back:     Symmetric, no curvature, ROM normal, no CVA tenderness  Lungs:     Clear to auscultation bilaterally, respirations unlabored  Chest Wall:    No tenderness or deformity   Heart:    Regular rate and rhythm, S1 and S2 normal, no murmur, rub   or gallop  Breast Exam:    Deferred to mammo  Abdomen:     Soft, non-tender, bowel sounds active all four quadrants,    no masses, no organomegaly  Genitalia:    Deferred to GYN  Rectal:    Extremities:   Extremities normal, atraumatic, no cyanosis or edema  Pulses:   2+ and symmetric all extremities  Skin:   Skin color, texture, turgor normal, no rashes or lesions  Lymph nodes:   Cervical, supraclavicular, and axillary nodes normal  Neurologic:   CNII-XII intact, normal strength, sensation and reflexes    throughout          Assessment & Plan:

## 2021-07-06 NOTE — Assessment & Plan Note (Signed)
Referred back to GI for complete evaluation

## 2021-07-09 ENCOUNTER — Telehealth: Payer: Self-pay | Admitting: Gastroenterology

## 2021-07-09 LAB — HEPATITIS C ANTIBODY
Hepatitis C Ab: NONREACTIVE
SIGNAL TO CUT-OFF: <0.02 (ref <1.00)

## 2021-07-09 LAB — HIV ANTIBODY (ROUTINE TESTING W REFLEX): HIV 1&2 Ab, 4th Generation: NONREACTIVE

## 2021-07-09 NOTE — Telephone Encounter (Signed)
Good morning Dr. Tarri Glenn & Dr. Lyndel Safe,  Patient called this morning requesting to switch from Dr. Tarri Glenn to Dr. Lyndel Safe, as her sister sees Dr. Lyndel Safe.  She has never actually seen Dr. Tarri Glenn and the only contact she had was one telephone call back in September 2020.  Please let me know if you both approve of the switch.  Thank you.

## 2021-07-12 NOTE — Telephone Encounter (Signed)
Okay by me RG

## 2021-07-13 ENCOUNTER — Encounter: Payer: Self-pay | Admitting: Gastroenterology

## 2021-08-10 ENCOUNTER — Other Ambulatory Visit: Payer: Self-pay

## 2021-08-10 ENCOUNTER — Ambulatory Visit (INDEPENDENT_AMBULATORY_CARE_PROVIDER_SITE_OTHER): Payer: 59 | Admitting: Gastroenterology

## 2021-08-10 ENCOUNTER — Encounter: Payer: Self-pay | Admitting: Gastroenterology

## 2021-08-10 VITALS — BP 148/90 | HR 64 | Ht 63.0 in | Wt 184.5 lb

## 2021-08-10 DIAGNOSIS — Z800 Family history of malignant neoplasm of digestive organs: Secondary | ICD-10-CM | POA: Diagnosis not present

## 2021-08-10 DIAGNOSIS — K581 Irritable bowel syndrome with constipation: Secondary | ICD-10-CM

## 2021-08-10 NOTE — Progress Notes (Signed)
? ? ?Chief Complaint: For colonoscopy ? ?Referring Provider:  Midge Minium, MD    ? ? ?ASSESSMENT AND PLAN;  ? ?#1. FH colon cancer (sis at age 61) ? ?#2. IBS-C ? ?Plan: ?-Colon with 2 day prep (miralax with Clenpiq) ?-Continue current diet. ?-Increase water intake. ? ? ? ?Discussed risks & benefits of colonoscopy. Risks including rare perforation req laparotomy, bleeding after bx/polypectomy req blood transfusion, rarely missing neoplasms, risks of anesthesia/sedation, rare risk of damage to internal organs. Benefits outweigh the risks. Patient agrees to proceed. All the questions were answered. Pt consents to proceed. ? ? ? ?HPI:   ? ?Erin Williamson is a 61 y.o. female  ? ?Dx with IBS-C over 15 - 20 years ago. ?Much better with FODMAP lactose-free diet. ?She does drink plenty of water ?Has Bms 1/day. ?Denies having any further abdominal pain. ? ?Denies having any upper GI symptoms including nausea, vomiting, heartburn, regurgitation, odynophagia or dysphagia. ? ?Here to get colonoscopy performed. ? ? ?Previous GI work-up: ?Last colon 2017- neg, [place]-was told that she has very "twisted colon".  No reports in Care Everywhere. ?3 prior colonoscopies since age 26 were neg. She has been getting it every 3 years prior and now every 5 years. ?Past Medical History:  ?Diagnosis Date  ? Arthritis   ? GERD (gastroesophageal reflux disease)   ? Hyperlipidemia   ? Hypertension   ? Migraine   ? ? ?Past Surgical History:  ?Procedure Laterality Date  ? CESAREAN SECTION    ? 1987 and 197  ? tummy tuck    ? ? ?Family History  ?Problem Relation Age of Onset  ? COPD Mother   ? Heart attack Mother   ? Heart disease Mother   ? Hyperlipidemia Mother   ? Hypertension Mother   ? Early death Father   ? Stroke Sister   ? Arthritis Sister   ? Colon cancer Sister   ? Mental illness Son   ? Rectal cancer Neg Hx   ? Stomach cancer Neg Hx   ? Esophageal cancer Neg Hx   ? Liver cancer Neg Hx   ? Pancreatic cancer Neg Hx   ? ? ?Social  History  ? ?Tobacco Use  ? Smoking status: Former  ?  Types: Cigarettes  ? Smokeless tobacco: Never  ?Vaping Use  ? Vaping Use: Never used  ?Substance Use Topics  ? Alcohol use: Yes  ?  Comment: occassional  ? Drug use: Never  ? ? ?Current Outpatient Medications  ?Medication Sig Dispense Refill  ? Cholecalciferol (VITAMIN D-3 PO) Take by mouth.    ? FLUoxetine (PROZAC) 20 MG capsule TAKE 3 CAPSULES BY MOUTH EVERY DAY 270 capsule 1  ? LORazepam (ATIVAN) 1 MG tablet Take 0.5 tablets (0.5 mg total) by mouth 3 (three) times daily as needed for anxiety (panic attacks). 30 tablet 0  ? MAGNESIUM PO Take by mouth.    ? meloxicam (MOBIC) 15 MG tablet Take 0.5-1 tablets (7.5-15 mg total) by mouth daily as needed for pain. 30 tablet 6  ? Multiple Vitamin (MULTIVITAMIN PO) Take by mouth.    ? ?No current facility-administered medications for this visit.  ? ? ?Allergies  ?Allergen Reactions  ? Ofloxacin Hives, Itching and Rash  ? ? ?Review of Systems:  ?Constitutional: Denies fever, chills, diaphoresis, appetite change and fatigue.  ?HEENT: Denies photophobia, eye pain, redness, hearing loss, ear pain, congestion, sore throat, rhinorrhea, sneezing, mouth sores, neck pain, neck stiffness and tinnitus.   ?Respiratory:  Denies SOB, DOE, cough, chest tightness,  and wheezing.   ?Cardiovascular: Denies chest pain, palpitations and leg swelling.  ?Genitourinary: Denies dysuria, urgency, frequency, hematuria, flank pain and difficulty urinating.  ?Musculoskeletal: Denies myalgias, back pain, joint swelling, arthralgias and gait problem.  ?Skin: No rash.  ?Neurological: Denies dizziness, seizures, syncope, weakness, light-headedness, numbness and headaches.  ?Hematological: Denies adenopathy. Easy bruising, personal or family bleeding history  ?Psychiatric/Behavioral: Has anxiety or depression ? ?  ? ?Physical Exam:   ? ?BP (!) 148/90   Pulse 64   Ht '5\' 3"'$  (1.6 m)   Wt 184 lb 8 oz (83.7 kg)   SpO2 95%   BMI 32.68 kg/m?  ?Wt Readings  from Last 3 Encounters:  ?08/10/21 184 lb 8 oz (83.7 kg)  ?07/06/21 186 lb (84.4 kg)  ?05/22/21 185 lb 9.6 oz (84.2 kg)  ? ?Constitutional:  Well-developed, in no acute distress. ?Psychiatric: Normal mood and affect. Behavior is normal. ?HEENT: Pupils normal.  Conjunctivae are normal. No scleral icterus.  ?Cardiovascular: Normal rate, regular rhythm. No edema ?Pulmonary/chest: Effort normal and breath sounds normal. No wheezing, rales or rhonchi. ?Abdominal: Soft, nondistended. Nontender. Bowel sounds active throughout. There are no masses palpable. No hepatomegaly. ?Rectal: Deferred ?Neurological: Alert and oriented to person place and time. ?Skin: Skin is warm and dry. No rashes noted. ? ?Data Reviewed: I have personally reviewed following labs and imaging studies ? ?CBC: ?CBC Latest Ref Rng & Units 07/06/2021 04/03/2021 06/23/2020  ?WBC 4.0 - 10.5 K/uL 6.0 8.2 5.7  ?Hemoglobin 12.0 - 15.0 g/dL 13.4 15.0 13.1  ?Hematocrit 36.0 - 46.0 % 40.5 44.3 39.1  ?Platelets 150.0 - 400.0 K/uL 247.0 289 229.0  ? ? ?CMP: ?CMP Latest Ref Rng & Units 07/06/2021 04/03/2021 06/23/2020  ?Glucose 70 - 99 mg/dL 73 114(H) 81  ?BUN 6 - 23 mg/dL '14 12 16  '$ ?Creatinine 0.40 - 1.20 mg/dL 0.63 0.51 0.66  ?Sodium 135 - 145 mEq/L 139 139 137  ?Potassium 3.5 - 5.1 mEq/L 4.1 3.8 3.8  ?Chloride 96 - 112 mEq/L 100 102 101  ?CO2 19 - 32 mEq/L 33(H) 28 31  ?Calcium 8.4 - 10.5 mg/dL 10.1 9.8 9.7  ?Total Protein 6.0 - 8.3 g/dL 7.3 8.3(H) 7.5  ?Total Bilirubin 0.2 - 1.2 mg/dL 0.8 1.2 0.9  ?Alkaline Phos 39 - 117 U/L 75 66 71  ?AST 0 - 37 U/L '21 25 21  '$ ?ALT 0 - 35 U/L '20 24 18  '$ ? ? ? ? ?Carmell Austria, MD 08/10/2021, 9:06 AM ? ?Cc: Midge Minium, MD ? ? ?

## 2021-08-10 NOTE — Patient Instructions (Addendum)
If you are age 63 or older, your body mass index should be between 23-30. Your Body mass index is 32.68 kg/m?Marland Kitchen If this is out of the aforementioned range listed, please consider follow up with your Primary Care Provider. ? ?If you are age 53 or younger, your body mass index should be between 19-25. Your Body mass index is 32.68 kg/m?Marland Kitchen If this is out of the aformentioned range listed, please consider follow up with your Primary Care Provider.  ? ?________________________________________________________ ? ?The [facility] providers would like to encourage you to use [facility] to communicate with providers for non-urgent requests or questions.  Due to long hold times on the telephone, sending your provider a message by [facility] may be a faster and more efficient way to get a response.  Please allow 48 business hours for a response.  Please remember that this is for non-urgent requests.  ?_______________________________________________________ ? ?You have been scheduled for a colonoscopy. Please follow written instructions given to you at your visit today.  ?Please pick up your prep supplies at the pharmacy within the next 1-3 days. ?If you use inhalers (even only as needed), please bring them with you on the day of your procedure. ? ?Two days before your procedure: Mix 3 packs (or capfuls) of Miralax in 48 ounces of clear liquid and drink at 6pm. ? ?We have given you samples of the following medication to take: ?Clenpiq ? ? ?We want to thank you for trusting [facility] with your care. All of our staff and providers value the relationships we have built with our patients, and it is an honor to care for you.  ? ?We are writing to let you know that [facility] will close on Oct 08, 2021, and we invite you to continue to see Dr. Carmell Austria and Gerrit Heck at the [facility] [place] office location. We are consolidating our serices at these [facility] practices to  better provide care. Our office staff will work with you to ensure a seamless transition.  ? ?Gerrit Heck, DO -Dr. Bryan Lemma will be movig to [facility] at [address], [place], [place] [address], effective Oct 08, 2021.  Contact [phone] to schedule an appointment with him.  ? ?Carmell Austria, MD- Dr. Lyndel Safe will be movig to [facility] at [address], [place], [place] [address], effective Oct 08, 2021.  Contact [phone] to schedule an appointment with him.  ? ?Requesting Medical Records ?If you need to request your medical records, please follow the instructions below. Your medical records are confidential, and a copy can be transferred to another provider or released to you or another person you designate only with your permission. ? ?There are several ways to request your medical records: ?Requests for medical records can be submitted through our practice.   ?You can also request your records electronically, in your MyChart account by selecting the ?Request Health Records? tab.  ?If you need additional information on how to request records, please go to [email or web address]/, choose Patient Information, then select Request Medical Records. ?To make an appointment or if you have any questions about your health care needs, please contact our office at [phone] and one of our staff members will be glad to assist you. ?[facility] is committed to providing exceptional care for you and our community. Thank you for allowing [place] to serve your health care needs. ?Sincerely, ? ?Windy Canny, Director [facility] ?[facility] also offers convenient virtual care  options. Sore throat? Sinus problems? Cold or flu symptoms? Get care from the comfort of home with [facility] Video Visits and e-Visits. Learn more about the non-emergency conditions treated and start your virtual visit at [email or web address] ? ?Thank you, ? ?Dr. Jackquline Denmark ? ? ?

## 2021-08-17 ENCOUNTER — Encounter: Payer: Self-pay | Admitting: Gastroenterology

## 2021-08-17 ENCOUNTER — Other Ambulatory Visit: Payer: Self-pay

## 2021-08-17 ENCOUNTER — Ambulatory Visit (AMBULATORY_SURGERY_CENTER): Payer: 59 | Admitting: Gastroenterology

## 2021-08-17 VITALS — BP 151/78 | HR 82 | Temp 98.0°F | Resp 19 | Ht 63.0 in | Wt 184.0 lb

## 2021-08-17 DIAGNOSIS — K5730 Diverticulosis of large intestine without perforation or abscess without bleeding: Secondary | ICD-10-CM | POA: Diagnosis not present

## 2021-08-17 DIAGNOSIS — K5140 Inflammatory polyps of colon without complications: Secondary | ICD-10-CM

## 2021-08-17 DIAGNOSIS — D120 Benign neoplasm of cecum: Secondary | ICD-10-CM

## 2021-08-17 DIAGNOSIS — K640 First degree hemorrhoids: Secondary | ICD-10-CM

## 2021-08-17 DIAGNOSIS — K581 Irritable bowel syndrome with constipation: Secondary | ICD-10-CM

## 2021-08-17 DIAGNOSIS — Z800 Family history of malignant neoplasm of digestive organs: Secondary | ICD-10-CM | POA: Diagnosis present

## 2021-08-17 DIAGNOSIS — K635 Polyp of colon: Secondary | ICD-10-CM

## 2021-08-17 DIAGNOSIS — D125 Benign neoplasm of sigmoid colon: Secondary | ICD-10-CM

## 2021-08-17 MED ORDER — SODIUM CHLORIDE 0.9 % IV SOLN: 500.0000 mL | Freq: Once | INTRAVENOUS | Status: DC

## 2021-08-17 NOTE — Progress Notes (Signed)
Called to room to assist during endoscopic procedure.  Patient ID and intended procedure confirmed with present staff. Received instructions for my participation in the procedure from the performing physician.  

## 2021-08-17 NOTE — Patient Instructions (Signed)
Follow a high fiber diet. Resume previous medications. Awaiting pathology results. Repeat Colonoscopy date to be determined based on pathology results. ? ?YOU HAD AN ENDOSCOPIC PROCEDURE TODAY AT [facility]:   Refer to the procedure report that was given to you for any specific questions about what was found during the examination.  If the procedure report does not answer your questions, please call your gastroenterologist to clarify.  If you requested that your care partner not be given the details of your procedure findings, then the procedure report has been included in a sealed envelope for you to review at your convenience later. ? ?YOU SHOULD EXPECT: Some feelings of bloating in the abdomen. Passage of more gas than usual.  Walking can help get rid of the air that was put into your GI tract during the procedure and reduce the bloating. If you had a lower endoscopy (such as a colonoscopy or flexible sigmoidoscopy) you may notice spotting of blood in your stool or on the toilet paper. If you underwent a bowel prep for your procedure, you may not have a normal bowel movement for a few days. ? ?Please Note:  You might notice some irritation and congestion in your nose or some drainage.  This is from the oxygen used during your procedure.  There is no need for concern and it should clear up in a day or so. ? ?SYMPTOMS TO REPORT IMMEDIATELY: ? ?Following lower endoscopy (colonoscopy or flexible sigmoidoscopy): ? Excessive amounts of blood in the stool ? Significant tenderness or worsening of abdominal pains ? Swelling of the abdomen that is new, acute ? Fever of 100?F or higher ? ?For urgent or emergent issues, a gastroenterologist can be reached at any hour by calling [phone]. ?Do not use MyChart messaging for urgent concerns.  ? ? ?DIET:  We do recommend a small meal at first, but then you may proceed to your regular diet.  Drink plenty of fluids but you should avoid alcoholic beverages  for 24 hours. ? ?ACTIVITY:  You should plan to take it easy for the rest of today and you should NOT DRIVE or use heavy machinery until tomorrow (because of the sedation medicines used during the test).   ? ?FOLLOW UP: ?Our staff will call the number listed on your records 48-72 hours following your procedure to check on you and address any questions or concerns that you may have regarding the information given to you following your procedure. If we do not reach you, we will leave a message.  We will attempt to reach you two times.  During this call, we will ask if you have developed any symptoms of COVID 19. If you develop any symptoms (ie: fever, flu-like symptoms, shortness of breath, cough etc.) before then, please call [phone].  If you test positive for Covid 19 in the 2 weeks post procedure, please call and report this information to [place].   ? ?If any biopsies were taken you will be contacted by phone or by letter within the next 1-3 weeks.  Please call us at [phone] if you have not heard about the biopsies in 3 weeks.  ? ? ?SIGNATURES/CONFIDENTIALITY: ?You and/or your care partner have signed paperwork which will be entered into your electronic medical record.  These signatures attest to the fact that that the information above on your After Visit Summary has been reviewed and is understood.  Full responsibility of the confidentiality of this discharge information lies with you and/or  your care-partner.  ?

## 2021-08-17 NOTE — Progress Notes (Signed)
PT taken to PACU. Monitors in place. VSS. Report given to RN. 

## 2021-08-17 NOTE — Op Note (Signed)
[facility] ?Patient Name: Erin Williamson ?Procedure Date: 08/17/2021 8:02 AM ?MRN: [id number] ?Endoscopist: Jackquline Denmark , MD ?Age: 61 ?Referring MD:  ?Date of Birth: [birth year] ?Gender: Female ?Account #: [id number] ?Procedure:                Colonoscopy ?Indications:              Screening in patient at increased risk: Colorectal  ?                          cancer in sister before age 61. IBS-C ?Medicines:                Monitored Anesthesia Care ?Procedure:                Pre-Anesthesia Assessment: ?                          - Prior to the procedure, a History and Physical  ?                          was performed, and patient medications and  ?                          allergies were reviewed. The patient's tolerance of  ?                          previous anesthesia was also reviewed. The risks  ?                          and benefits of the procedure and the sedation  ?                          options and risks were discussed with the patient.  ?                          All questions were answered, and informed consent  ?                          was obtained. Prior Anticoagulants: The patient has  ?                          taken no previous anticoagulant or antiplatelet  ?                          agents. ASA Grade Assessment: II - A patient with  ?                          mild systemic disease. After reviewing the risks  ?                          and benefits, the patient was deemed in  ?                          satisfactory condition to undergo the procedure. ?  After obtaining informed consent, the colonoscope  ?                          was passed under direct vision. Throughout the  ?                          procedure, the patient's blood pressure, pulse, and  ?                          oxygen saturations were monitored continuously. The  ?                          Olympus PCF-H190DL (#[id number]) Colonoscope was  ?                          introduced through the  anus and advanced to the 2  ?                          cm into the ileum. The colonoscopy was performed  ?                          without difficulty. The patient tolerated the  ?                          procedure well. The quality of the bowel  ?                          preparation was good. The terminal ileum, ileocecal  ?                          valve, appendiceal orifice, and rectum were  ?                          photographed. ?Scope In: 8:11:24 AM ?Scope Out: 8:33:03 AM ?Scope Withdrawal Time: 0 hours 14 minutes 42 seconds  ?Total Procedure Duration: 0 hours 21 minutes 39 seconds  ?Findings:                 A 6 mm polyp was found in the mid sigmoid colon.  ?                          The polyp was sessile. The polyp was removed with a  ?                          cold snare. Resection and retrieval were complete. ?                          A single (solitary) 10 mm flat ulcer was found at  ?                          the ileocecal valve. No bleeding was present. No  ?                          stigmata of  recent bleeding were seen. Biopsies  ?                          were taken with a cold forceps for histology. ?                          Multiple medium-mouthed diverticula were found in  ?                          the sigmoid colon, transverse colon and ascending  ?                          colon. ?                          Non-bleeding internal hemorrhoids were found during  ?                          retroflexion. The hemorrhoids were small and Grade  ?                          I (internal hemorrhoids that do not prolapse). ?                          The terminal ileum appeared normal. ?                          The exam was otherwise without abnormality on  ?                          direct and retroflexion views. The colon was  ?                          tortuous. ?Complications:            No immediate complications. ?Estimated Blood Loss:     Estimated blood loss: none. ?Impression:               -  One 6 mm polyp in the mid sigmoid colon, removed  ?                          with a cold snare. Resected and retrieved. ?                          - A single (solitary) flat benign ulcer at the  ?                          ileocecal valve. Biopsied. (Could be due to prep or  ?                          nonsteroidals) ?                          - Moderate pancolonic diverticulosis. ?                          -  Non-bleeding internal hemorrhoids. ?                          - The examined portion of the ileum was normal. ?                          - The examination was otherwise normal on direct  ?                          and retroflexion views. ?Recommendation:           - Patient has a contact number available for  ?                          emergencies. The signs and symptoms of potential  ?                          delayed complications were discussed with the  ?                          patient. Return to normal activities tomorrow.  ?                          Written discharge instructions were provided to the  ?                          patient. ?                          - High fiber diet. ?                          - Continue present medications. ?                          - Await pathology results. ?                          - Avoid nonsteroidals. ?                          - Repeat colonoscopy for surveillance based on  ?                          pathology results. ?                          - The findings and recommendations were discussed  ?                          with the patient's family. ?Jackquline Denmark, MD ?08/17/2021 8:39:27 AM ?This report has been signed electronically. ?

## 2021-08-17 NOTE — Progress Notes (Signed)
Pt's states no medical or surgical changes since previsit or office visit. 

## 2021-08-17 NOTE — Progress Notes (Signed)
? ? ?Chief Complaint: For colonoscopy ? ?Referring Provider:  Midge Minium, MD    ? ? ?ASSESSMENT AND PLAN;  ? ?#1. FH colon cancer (sis at age 61) ? ?#2. IBS-C ? ?Plan: ?-Colon with 2 day prep (miralax with Clenpiq) ?-Continue current diet. ?-Increase water intake. ? ? ? ?Discussed risks & benefits of colonoscopy. Risks including rare perforation req laparotomy, bleeding after bx/polypectomy req blood transfusion, rarely missing neoplasms, risks of anesthesia/sedation, rare risk of damage to internal organs. Benefits outweigh the risks. Patient agrees to proceed. All the questions were answered. Pt consents to proceed. ? ? ? ?HPI:   ? ?Erin Williamson is a 61 y.o. female  ? ?Dx with IBS-C over 15 - 20 years ago. ?Much better with FODMAP lactose-free diet. ?She does drink plenty of water ?Has Bms 1/day. ?Denies having any further abdominal pain. ? ?Denies having any upper GI symptoms including nausea, vomiting, heartburn, regurgitation, odynophagia or dysphagia. ? ?Here to get colonoscopy performed. ? ? ?Previous GI work-up: ?Last colon 2017- neg, [place]-was told that she has very "twisted colon".  No reports in Care Everywhere. ?3 prior colonoscopies since age 41 were neg. She has been getting it every 3 years prior and now every 5 years. ?Past Medical History:  ?Diagnosis Date  ? Arthritis   ? GERD (gastroesophageal reflux disease)   ? Hyperlipidemia   ? Hypertension   ? Migraine   ? ? ?Past Surgical History:  ?Procedure Laterality Date  ? CESAREAN SECTION    ? 1987 and 197  ? tummy tuck    ? ? ?Family History  ?Problem Relation Age of Onset  ? COPD Mother   ? Heart attack Mother   ? Heart disease Mother   ? Hyperlipidemia Mother   ? Hypertension Mother   ? Early death Father   ? Stroke Sister   ? Arthritis Sister   ? Colon cancer Sister   ? Mental illness Son   ? Rectal cancer Neg Hx   ? Stomach cancer Neg Hx   ? Esophageal cancer Neg Hx   ? Liver cancer Neg Hx   ? Pancreatic cancer Neg Hx   ? ? ?Social  History  ? ?Tobacco Use  ? Smoking status: Former  ?  Types: Cigarettes  ? Smokeless tobacco: Never  ?Vaping Use  ? Vaping Use: Never used  ?Substance Use Topics  ? Alcohol use: Yes  ?  Comment: occassional  ? Drug use: Never  ? ? ?Current Outpatient Medications  ?Medication Sig Dispense Refill  ? Cholecalciferol (VITAMIN D-3 PO) Take by mouth.    ? FLUoxetine (PROZAC) 20 MG capsule TAKE 3 CAPSULES BY MOUTH EVERY DAY 270 capsule 1  ? MAGNESIUM PO Take by mouth.    ? Multiple Vitamin (MULTIVITAMIN PO) Take by mouth.    ? LORazepam (ATIVAN) 1 MG tablet Take 0.5 tablets (0.5 mg total) by mouth 3 (three) times daily as needed for anxiety (panic attacks). 30 tablet 0  ? meloxicam (MOBIC) 15 MG tablet Take 0.5-1 tablets (7.5-15 mg total) by mouth daily as needed for pain. 30 tablet 6  ? ?Current Facility-Administered Medications  ?Medication Dose Route Frequency Provider Last Rate Last Admin  ? 0.9 %  sodium chloride infusion  500 mL Intravenous Once Jackquline Denmark, MD      ? ? ?Allergies  ?Allergen Reactions  ? Ofloxacin Hives, Itching and Rash  ? ? ?Review of Systems:  ?Constitutional: Denies fever, chills, diaphoresis, appetite change and fatigue.  ?  HEENT: Denies photophobia, eye pain, redness, hearing loss, ear pain, congestion, sore throat, rhinorrhea, sneezing, mouth sores, neck pain, neck stiffness and tinnitus.   ?Respiratory: Denies SOB, DOE, cough, chest tightness,  and wheezing.   ?Cardiovascular: Denies chest pain, palpitations and leg swelling.  ?Genitourinary: Denies dysuria, urgency, frequency, hematuria, flank pain and difficulty urinating.  ?Musculoskeletal: Denies myalgias, back pain, joint swelling, arthralgias and gait problem.  ?Skin: No rash.  ?Neurological: Denies dizziness, seizures, syncope, weakness, light-headedness, numbness and headaches.  ?Hematological: Denies adenopathy. Easy bruising, personal or family bleeding history  ?Psychiatric/Behavioral: Has anxiety or depression ? ?  ? ?Physical  Exam:   ? ?BP (!) 160/91   Pulse 73   Temp 98 ?F (36.7 ?C) (Temporal)   Resp 11   Ht '5\' 3"'$  (1.6 m)   Wt 184 lb (83.5 kg)   SpO2 99%   BMI 32.59 kg/m?  ?Wt Readings from Last 3 Encounters:  ?08/17/21 184 lb (83.5 kg)  ?08/10/21 184 lb 8 oz (83.7 kg)  ?07/06/21 186 lb (84.4 kg)  ? ?Constitutional:  Well-developed, in no acute distress. ?Psychiatric: Normal mood and affect. Behavior is normal. ?HEENT: Pupils normal.  Conjunctivae are normal. No scleral icterus.  ?Cardiovascular: Normal rate, regular rhythm. No edema ?Pulmonary/chest: Effort normal and breath sounds normal. No wheezing, rales or rhonchi. ?Abdominal: Soft, nondistended. Nontender. Bowel sounds active throughout. There are no masses palpable. No hepatomegaly. ?Rectal: Deferred ?Neurological: Alert and oriented to person place and time. ?Skin: Skin is warm and dry. No rashes noted. ? ?Data Reviewed: I have personally reviewed following labs and imaging studies ? ?CBC: ? ?  Latest Ref Rng & Units 07/06/2021  ? 10:52 AM 04/03/2021  ?  8:25 AM 06/23/2020  ? 10:32 AM  ?CBC  ?WBC 4.0 - 10.5 K/uL 6.0   8.2   5.7    ?Hemoglobin 12.0 - 15.0 g/dL 13.4   15.0   13.1    ?Hematocrit 36.0 - 46.0 % 40.5   44.3   39.1    ?Platelets 150.0 - 400.0 K/uL 247.0   289   229.0    ? ? ?CMP: ? ?  Latest Ref Rng & Units 07/06/2021  ? 10:52 AM 04/03/2021  ?  8:25 AM 06/23/2020  ? 10:32 AM  ?CMP  ?Glucose 70 - 99 mg/dL 73   114   81    ?BUN 6 - 23 mg/dL '14   12   16    '$ ?Creatinine 0.40 - 1.20 mg/dL 0.63   0.51   0.66    ?Sodium 135 - 145 mEq/L 139   139   137    ?Potassium 3.5 - 5.1 mEq/L 4.1   3.8   3.8    ?Chloride 96 - 112 mEq/L 100   102   101    ?CO2 19 - 32 mEq/L 33   28   31    ?Calcium 8.4 - 10.5 mg/dL 10.1   9.8   9.7    ?Total Protein 6.0 - 8.3 g/dL 7.3   8.3   7.5    ?Total Bilirubin 0.2 - 1.2 mg/dL 0.8   1.2   0.9    ?Alkaline Phos 39 - 117 U/L 75   66   71    ?AST 0 - 37 U/L '21   25   21    '$ ?ALT 0 - 35 U/L '20   24   18    '$ ? ? ? ? ?Carmell Austria, MD 08/17/2021, 8:08  AM ? ?Cc: Midge Minium, MD ? ? ?

## 2021-08-21 ENCOUNTER — Encounter: Payer: Self-pay | Admitting: Gastroenterology

## 2021-08-21 ENCOUNTER — Telehealth: Payer: Self-pay

## 2021-08-21 NOTE — Telephone Encounter (Signed)
?  Follow up Call- ? ? ?  08/17/2021  ?  7:15 AM  ?Call back number  ?Post procedure Call Back phone  # [phone]  ?Permission to leave phone message Yes  ?  ? ?Patient questions: ? ?Do you have a fever, pain , or abdominal swelling? No. ?Pain Score  0 * ? ?Have you tolerated food without any problems? Yes.   ? ?Have you been able to return to your normal activities? Yes.   ? ?Do you have any questions about your discharge instructions: ?Diet   No. ?Medications  No. ?Follow up visit  No. ? ?Do you have questions or concerns about your Care? No. ? ?Actions: ?* If pain score is 4 or above: ?No action needed, pain <4. ? ?Have you developed a fever since your procedure? no ? ?2.   Have you had an respiratory symptoms (SOB or cough) since your procedure? no ? ?3.   Have you tested positive for COVID 19 since your procedure no ? ?4.   Have you had any family members/close contacts diagnosed with the COVID 19 since your procedure?  no ? ? ?If yes to any of these questions please route to Joylene John, RN and Joella Prince, RN ? ? ? ?

## 2021-08-23 ENCOUNTER — Encounter: Payer: Self-pay | Admitting: Gastroenterology

## 2021-09-24 ENCOUNTER — Other Ambulatory Visit: Payer: Self-pay | Admitting: Family Medicine

## 2021-09-24 ENCOUNTER — Encounter: Payer: Self-pay | Admitting: Family Medicine

## 2021-09-24 MED ORDER — LORAZEPAM 1 MG PO TABS: 0.5000 mg | ORAL_TABLET | Freq: Three times a day (TID) | ORAL | 0 refills | Status: DC | PRN

## 2021-09-24 NOTE — Telephone Encounter (Signed)
Patient is requesting a refill of the following medications: ?Requested Prescriptions  ? ?Pending Prescriptions Disp Refills  ? LORazepam (ATIVAN) 1 MG tablet 30 tablet 0  ?  Sig: Take 0.5 tablets (0.5 mg total) by mouth 3 (three) times daily as needed for anxiety (panic attacks).  ? ? ?Date of patient request: 09/24/21 ?Last office visit: 07/06/21 ?Date of last refill: 05/22/21 ?Last refill amount: 30 ? ? ?

## 2021-10-02 ENCOUNTER — Telehealth: Payer: Self-pay | Admitting: Family Medicine

## 2021-10-02 ENCOUNTER — Ambulatory Visit (INDEPENDENT_AMBULATORY_CARE_PROVIDER_SITE_OTHER): Payer: 59 | Admitting: Family Medicine

## 2021-10-02 ENCOUNTER — Other Ambulatory Visit: Payer: Self-pay

## 2021-10-02 DIAGNOSIS — Z23 Encounter for immunization: Secondary | ICD-10-CM

## 2021-10-02 DIAGNOSIS — F411 Generalized anxiety disorder: Secondary | ICD-10-CM

## 2021-10-02 MED ORDER — FLUOXETINE HCL 20 MG PO CAPS: 60.0000 mg | ORAL_CAPSULE | Freq: Every day | ORAL | 1 refills | Status: DC

## 2021-10-02 NOTE — Telephone Encounter (Signed)
Rx sent to pharmacy   

## 2021-10-02 NOTE — Telephone Encounter (Signed)
Pt called in asking for a refill on the fluoxetine, [facility] [address]  ? ?Pt has an appt on Friday but may need to cancel it due to starting a new job.  ? ? ?

## 2021-10-02 NOTE — Progress Notes (Signed)
Gave Shingrix vaccine . Left deltoid . Pt tolerated well .  ?

## 2021-10-05 ENCOUNTER — Ambulatory Visit: Payer: Medicaid Other | Admitting: Family Medicine

## 2021-10-26 ENCOUNTER — Other Ambulatory Visit: Payer: Self-pay | Admitting: Family Medicine

## 2021-10-26 ENCOUNTER — Encounter: Payer: Self-pay | Admitting: Family Medicine

## 2021-10-26 ENCOUNTER — Ambulatory Visit (INDEPENDENT_AMBULATORY_CARE_PROVIDER_SITE_OTHER): Payer: 59 | Admitting: Family Medicine

## 2021-10-26 ENCOUNTER — Other Ambulatory Visit: Payer: Self-pay

## 2021-10-26 VITALS — BP 152/82 | HR 70 | Temp 97.7°F | Resp 17 | Ht 63.0 in | Wt 182.4 lb

## 2021-10-26 DIAGNOSIS — E669 Obesity, unspecified: Secondary | ICD-10-CM

## 2021-10-26 DIAGNOSIS — M542 Cervicalgia: Secondary | ICD-10-CM | POA: Diagnosis not present

## 2021-10-26 DIAGNOSIS — H43391 Other vitreous opacities, right eye: Secondary | ICD-10-CM | POA: Diagnosis not present

## 2021-10-26 DIAGNOSIS — I10 Essential (primary) hypertension: Secondary | ICD-10-CM

## 2021-10-26 MED ORDER — AMLODIPINE BESYLATE 5 MG PO TABS: 5.0000 mg | ORAL_TABLET | Freq: Every day | ORAL | 1 refills | Status: DC

## 2021-10-26 MED ORDER — WEGOVY 0.25 MG/0.5ML ~~LOC~~ SOAJ: 0.2500 mg | SUBCUTANEOUS | 1 refills | Status: DC

## 2021-10-26 NOTE — Patient Instructions (Addendum)
Follow up in 1 month to recheck BP START the Amlodipine once daily for blood pressure We'll try and get the [facility] approved In the meantime, continue to work on low carb diet and regular exercise We'll call you to schedule your eye appt Use the Methocarbamol and Voltaren gel.  Add heat for spasm relief Call with any questions or concerns Have a great weekend!

## 2021-10-26 NOTE — Progress Notes (Signed)
   Subjective:    Patient ID: Erin Williamson, female    DOB: 17-Jul-1960, 61 y.o.   MRN: [id number]  HPI HTN- pt's BP is again elevated which places her in the hypertensive category.  'i think it's my weight'.  No CP, SOB, HAs, edema.  Obesity- pt reports eating a healthy diet but struggles w/ portion control.  Is walking regularly, has a treadmill.  'i've never had a weight problem until I hit menopause'.  Pt is interested in Mier or [id number].    Visual changes- pt reports increased floaters, particularly R eye.  'it feels weird'.  Has appt upcoming on 7/20.  Did have a flash in the periphery of R eye but this has since stopped.  Pt reports eye is just 'uncomfortable'.   Neck pain- 'i think I pinched a nerve in my neck'.  Unable to look down or to the L.  Was given muscle relaxer (Methocarbamol) and Voltaren gel yesterday by NP.  Used last night for 1st time.   Review of Systems For ROS see HPI     Objective:   Physical Exam Vitals reviewed.  Constitutional:      General: She is not in acute distress.    Appearance: Normal appearance. She is well-developed. She is not ill-appearing.  HENT:     Head: Normocephalic and atraumatic.  Eyes:     Extraocular Movements: Extraocular movements intact.     Conjunctiva/sclera: Conjunctivae normal.     Pupils: Pupils are equal, round, and reactive to light.  Neck:     Thyroid: No thyromegaly.  Cardiovascular:     Rate and Rhythm: Normal rate and regular rhythm.     Pulses: Normal pulses.     Heart sounds: Normal heart sounds. No murmur heard. Pulmonary:     Effort: Pulmonary effort is normal. No respiratory distress.     Breath sounds: Normal breath sounds.  Abdominal:     General: There is no distension.     Palpations: Abdomen is soft.     Tenderness: There is no abdominal tenderness.  Musculoskeletal:        General: Tenderness (TTP over R upper trap) present.     Cervical back: Normal range of motion and neck supple.      Comments: Limited flexion and rotation of neck  Lymphadenopathy:     Cervical: No cervical adenopathy.  Skin:    General: Skin is warm and dry.  Neurological:     Mental Status: She is alert and oriented to person, place, and time.  Psychiatric:        Mood and Affect: Mood normal.        Behavior: Behavior normal.          Assessment & Plan:  Neck pain- new.  R sided.  + trap spasm on PE.  Agree w/ methocarbamol and Voltaren gel prescribed by NP.  Encouraged her to add heat and gentle stretching exercises.  Pt expressed understanding and is in agreement w/ plan.

## 2021-10-28 DIAGNOSIS — H43391 Other vitreous opacities, right eye: Secondary | ICD-10-CM | POA: Insufficient documentation

## 2021-10-28 NOTE — Assessment & Plan Note (Signed)
Ongoing issue.  She reports healthy diet but struggles w/ portion control.  She is walking regularly but is very frustrated b/c she 'never had a weight problem until I hit menopause'.  Is very interested in Ozempic/Wegovy.  She is willing to use this in conjunction w/ healthy diet and regular exercise.  She is aware that insurance may not cover medication but we will try.

## 2021-10-28 NOTE — Assessment & Plan Note (Signed)
New.  Pt's BP has been elevated on multiple occasions.  This now officially qualifies as HTN.  She feels this is all weight related.  She is willing to work on healthy diet and regular exercise to lose weight but in the meantime, we will start low dose Amlodipine to bring BP into range.  Pt expressed understanding and is in agreement w/ plan.

## 2021-10-28 NOTE — Assessment & Plan Note (Signed)
New.  Pt has optometry appt upcoming in July but given her increase in floaters and discomfort, she needs an ophthalmology appt much sooner than that.  Referral placed.

## 2021-10-29 ENCOUNTER — Other Ambulatory Visit: Payer: Self-pay | Admitting: Family Medicine

## 2021-10-29 NOTE — Telephone Encounter (Signed)
Pharmacy comment: Product Backordered/Unavailable:MANUFACTURER CONFIRMED 0.25, 0.5 AND '1MG'$  UNAVAILABLE UNTIL SEPT.

## 2021-10-29 NOTE — Telephone Encounter (Signed)
Can you see if they sent a PA for doctor Tabori for this patient medication

## 2021-10-31 NOTE — Telephone Encounter (Signed)
If all doses are backordered, she will need to work on a low carb diet and regular physical activity.

## 2021-11-01 LAB — HM DIABETES EYE EXAM

## 2021-11-21 ENCOUNTER — Encounter: Payer: Self-pay | Admitting: Family Medicine

## 2022-01-31 ENCOUNTER — Encounter: Payer: Self-pay | Admitting: Family Medicine

## 2022-01-31 ENCOUNTER — Ambulatory Visit (INDEPENDENT_AMBULATORY_CARE_PROVIDER_SITE_OTHER): Payer: 59 | Admitting: Family Medicine

## 2022-01-31 VITALS — BP 132/80 | HR 65 | Temp 98.0°F | Resp 16 | Ht 63.0 in | Wt 187.5 lb

## 2022-01-31 DIAGNOSIS — Z6833 Body mass index (BMI) 33.0-33.9, adult: Secondary | ICD-10-CM | POA: Diagnosis not present

## 2022-01-31 DIAGNOSIS — E669 Obesity, unspecified: Secondary | ICD-10-CM

## 2022-01-31 LAB — CBC WITH DIFFERENTIAL/PLATELET
Basophils Absolute: 0 10*3/uL (ref 0.0–0.1)
Basophils Relative: 0.3 % (ref 0.0–3.0)
Eosinophils Absolute: 0.1 10*3/uL (ref 0.0–0.7)
Eosinophils Relative: 1.1 % (ref 0.0–5.0)
HCT: 39.7 % (ref 36.0–46.0)
Hemoglobin: 13 g/dL (ref 12.0–15.0)
Lymphocytes Relative: 32.6 % (ref 12.0–46.0)
Lymphs Abs: 2.1 10*3/uL (ref 0.7–4.0)
MCHC: 32.7 g/dL (ref 30.0–36.0)
MCV: 82.4 fl (ref 78.0–100.0)
Monocytes Absolute: 0.5 10*3/uL (ref 0.1–1.0)
Monocytes Relative: 7.7 % (ref 3.0–12.0)
Neutro Abs: 3.8 10*3/uL (ref 1.4–7.7)
Neutrophils Relative %: 58.3 % (ref 43.0–77.0)
Platelets: 233 10*3/uL (ref 150.0–400.0)
RBC: 4.81 Mil/uL (ref 3.87–5.11)
RDW: 14.5 % (ref 11.5–15.5)
WBC: 6.5 10*3/uL (ref 4.0–10.5)

## 2022-01-31 LAB — BASIC METABOLIC PANEL
BUN: 17 mg/dL (ref 6–23)
CO2: 29 mEq/L (ref 19–32)
Calcium: 9.8 mg/dL (ref 8.4–10.5)
Chloride: 101 mEq/L (ref 96–112)
Creatinine, Ser: 0.64 mg/dL (ref 0.40–1.20)
GFR: 94.19 mL/min (ref >60.00)
Glucose, Bld: 83 mg/dL (ref 70–99)
Potassium: 3.9 mEq/L (ref 3.5–5.1)
Sodium: 138 mEq/L (ref 135–145)

## 2022-01-31 LAB — HEPATIC FUNCTION PANEL
ALT: 21 U/L (ref 0–35)
AST: 23 U/L (ref 0–37)
Albumin: 4.4 g/dL (ref 3.5–5.2)
Alkaline Phosphatase: 78 U/L (ref 39–117)
Bilirubin, Direct: 0.1 mg/dL (ref 0.0–0.3)
Total Bilirubin: 0.7 mg/dL (ref 0.2–1.2)
Total Protein: 7.5 g/dL (ref 6.0–8.3)

## 2022-01-31 LAB — LIPID PANEL
Cholesterol: 213 mg/dL — ABNORMAL HIGH (ref 0–200)
HDL: 54 mg/dL (ref >39.00)
NonHDL: 159
Total CHOL/HDL Ratio: 4
Triglycerides: 225 mg/dL — ABNORMAL HIGH (ref 0.0–149.0)
VLDL: 45 mg/dL — ABNORMAL HIGH (ref 0.0–40.0)

## 2022-01-31 LAB — VITAMIN D 25 HYDROXY (VIT D DEFICIENCY, FRACTURES): VITD: 31.01 ng/mL (ref 30.00–100.00)

## 2022-01-31 LAB — TSH: TSH: 1.27 u[IU]/mL (ref 0.35–5.50)

## 2022-01-31 LAB — LDL CHOLESTEROL, DIRECT: Direct LDL: 148 mg/dL

## 2022-01-31 MED ORDER — WEGOVY 0.25 MG/0.5ML ~~LOC~~ SOAJ: SUBCUTANEOUS | 1 refills | Status: DC

## 2022-01-31 NOTE — Progress Notes (Unsigned)
   Subjective:    Patient ID: Erin Williamson, female    DOB: May 18, 1961, 61 y.o.   MRN: [id number]  HPI Obesity- pt was started on Wegovy in June but insurance didn't cover it..  She has not been able to get the medication.  She has gained 6 lbs.  BMI now 33.21  Pt reports she generally eats well.  Is not eating processed foods.  Does not drink soda.  Pt will walk for 15-20 minutes on her lunch break 'when it's not hot as hell'.  Pt decreased her Fluoxetine dose due to weight gain.     Review of Systems For ROS see HPI     Objective:   Physical Exam Vitals reviewed.  Constitutional:      General: She is not in acute distress.    Appearance: Normal appearance. She is obese. She is not ill-appearing.  HENT:     Head: Normocephalic and atraumatic.  Eyes:     Extraocular Movements: Extraocular movements intact.     Conjunctiva/sclera: Conjunctivae normal.     Pupils: Pupils are equal, round, and reactive to light.  Cardiovascular:     Rate and Rhythm: Normal rate and regular rhythm.  Pulmonary:     Effort: Pulmonary effort is normal. No respiratory distress.     Breath sounds: Normal breath sounds.  Skin:    General: Skin is warm and dry.  Neurological:     General: No focal deficit present.     Mental Status: She is alert and oriented to person, place, and time.  Psychiatric:        Mood and Affect: Mood normal.        Behavior: Behavior normal.        Thought Content: Thought content normal.           Assessment & Plan:

## 2022-01-31 NOTE — Patient Instructions (Signed)
Follow up 4-6 weeks after starting the Wegovy Inject the 0.'25mg'$  dose of Wegovy x4 weeks and then increase to 0.'5mg'$  weekly Continue to work on low carb diet Goal is for 30 minutes of physical activity daily Call with any questions or concerns Hang in there!!!

## 2022-02-01 ENCOUNTER — Ambulatory Visit: Payer: 59 | Admitting: Family Medicine

## 2022-02-05 NOTE — Assessment & Plan Note (Signed)
Deteriorated.  Pt was not able to start Wegovy due to insurance coverage.  6 lb weight gain since last visit.  She states she doesn't eat processed foods and 'generally eat well' but is not able to better explain what eating well means.  Does occasionally walk at lunch for 15-20 minutes but not when it is hot or bad weather.  Encouraged low carb diet, small but frequent meals throughout the day, regular exercise, and we can retry [facility] w/ her new insurance.  Pt expressed understanding and is in agreement w/ plan.

## 2022-02-08 ENCOUNTER — Telehealth: Payer: Self-pay | Admitting: Family Medicine

## 2022-02-08 NOTE — Telephone Encounter (Signed)
Kirke Shaggy is not available at this time either

## 2022-02-08 NOTE — Telephone Encounter (Signed)
She needs to call her pharmacy and see if Kirke Shaggy is available (this is a daily injection rather than weekly but has also had supply issues since the other medications are out of stock).

## 2022-02-08 NOTE — Telephone Encounter (Signed)
Caller name: Anely Spiewak  On DPR? :yes/no: Yes  Call back number:[phone]  Provider they see:  Birdie Riddle   Reason for call: Pt called stating that her pharmacy is out of stock wegovy 0.25 mg/0.5 ml until next year. Pt want to know if she can be switch to another medication until her medication is back in stock. Please advise

## 2022-02-08 NOTE — Telephone Encounter (Signed)
Pt called back; [facility], but has called around and saxenda is not available either. Please what pt should do next.

## 2022-02-08 NOTE — Telephone Encounter (Signed)
Pt has been notified [facility] will [facility] in [facility] area to try and find stock

## 2022-02-08 NOTE — Telephone Encounter (Signed)
Back order on Ozempic only available stregnth 1.7 and 2.4

## 2022-02-11 NOTE — Telephone Encounter (Signed)
Spoke w/ pt and advised pt that she can keep doing a low carb diet and regular exercise until they are able to produce more of the medications

## 2022-02-11 NOTE — Telephone Encounter (Signed)
Since Wegovy and Saxenda are not available at this time, I would work on low carb diet and regular exercise while the drug companies are able to produce a reliable supply

## 2022-02-15 ENCOUNTER — Telehealth: Payer: Self-pay

## 2022-02-15 NOTE — Telephone Encounter (Signed)
PA for Mancel Parsons has been sent to plan via CoverMyMeds

## 2022-02-18 NOTE — Telephone Encounter (Signed)
PA was denied for wegovy

## 2022-02-20 NOTE — Telephone Encounter (Signed)
Sent an appeal for the [employer] [place] thru Cover My Meds

## 2022-02-21 ENCOUNTER — Encounter: Payer: Self-pay | Admitting: Family Medicine

## 2022-03-06 ENCOUNTER — Telehealth: Payer: Self-pay | Admitting: Family Medicine

## 2022-03-06 NOTE — Telephone Encounter (Signed)
Pt states her insurance states that if we use HTN , obesity which we did , and document that she has tried weight loss and nutrition program that they will approve it . Do you wish to proceed

## 2022-03-06 NOTE — Telephone Encounter (Signed)
We can certainly try to add HTN (as we have already used obesity).  Her weight loss efforts (if any) would be documented in the notes.  This will be our last attempt for this medication

## 2022-03-06 NOTE — Telephone Encounter (Signed)
Caller name: Oluwatobi  On DPR? :yes/no: Yes  Call back number: [phone]  Provider they see: Birdie Riddle  Reason for call:  calling b/c spoke with ins and they said pt must have HTN and at least 30 kg per square meter for ins to cover Wegovy.

## 2022-03-07 NOTE — Telephone Encounter (Signed)
I did fax another appeal yesterday with the added dx of HTN and office notes attached waiting on response

## 2022-03-14 NOTE — Telephone Encounter (Signed)
Pt called stating she spoke with care mark about her paperwork. Care mark told pt that, whoever filled out the paperwork marked no for pt BMI. Pt states that [place] told her, her BMI does meet their requirements for this medication.

## 2022-03-14 NOTE — Telephone Encounter (Signed)
Pt BMI was put on the form due to it has to be noted . And the paperwork was faxed not once but twice more

## 2022-03-15 ENCOUNTER — Ambulatory Visit: Payer: 59 | Admitting: Family Medicine

## 2022-03-15 ENCOUNTER — Telehealth: Payer: Self-pay

## 2022-03-15 NOTE — Telephone Encounter (Signed)
[facility] sent the denial letter and on the second page it states we need to write a letter stating why the DR thinks the pt should have this medication along with the office notes that I have printed off. I have placed in Dr Georgena Spurling to be reviewed folder once she reviews them I will proceed with the next step

## 2022-03-15 NOTE — Telephone Encounter (Signed)
Sent DR Birdie Riddle a message regarding that [facility] is now needing a letter stating why she thinks the pt needs this medication that letter has been placed in her review folder

## 2022-03-15 NOTE — Telephone Encounter (Signed)
Spoke w/ pt and advised that I did spoke with Quitin @ [place] and he stated they had faxed a Criteria form but had faxed to the Dr Birdie Riddle old office in [place] ,[place] . I advised her he was sending me the form and once I receive it I will send it back along with office notes again

## 2022-03-15 NOTE — Telephone Encounter (Signed)
Pt is requesting Appeal be sent into the insurance with mention of her hypertension as well as notation of her BMI patient expressed must be Appeal, pt also requesting this note she has been part of weight loss program for more than a year

## 2022-03-15 NOTE — Telephone Encounter (Signed)
Spoke w/ pt  Pleas Erin Williamson at [employer] and I explained to him they have not sent [place] any criteria form to fill out only the [facility] PA Request form and there was no place to write in a BMI but I did write in on the form . He is suppose to fax a Criteria form so we can fill it out .

## 2022-03-19 ENCOUNTER — Telehealth: Payer: Self-pay

## 2022-03-19 DIAGNOSIS — Z1231 Encounter for screening mammogram for malignant neoplasm of breast: Secondary | ICD-10-CM

## 2022-03-19 NOTE — Telephone Encounter (Signed)
Faxed note and office notes to [phone] [facility] appeal . I have all forms .

## 2022-03-19 NOTE — Telephone Encounter (Signed)
Letter written and printed and given to [facility] to send

## 2022-03-19 NOTE — Telephone Encounter (Signed)
Patient requested imaging order for routine mammogram, okay to place order?

## 2022-03-19 NOTE — Telephone Encounter (Signed)
Letter has been faxed.

## 2022-03-20 NOTE — Telephone Encounter (Signed)
Ok to order mammo for routine screen

## 2022-03-20 NOTE — Telephone Encounter (Signed)
I have ordered the mammogram please let me know if I need to do more

## 2022-03-22 ENCOUNTER — Other Ambulatory Visit (HOSPITAL_COMMUNITY): Payer: Self-pay

## 2022-03-22 ENCOUNTER — Telehealth: Payer: Self-pay

## 2022-03-22 NOTE — Telephone Encounter (Signed)
[facility] approval letter scanned to chart

## 2022-03-22 NOTE — Telephone Encounter (Signed)
Received notification from [facility] regarding a prior authorization for [facility].   Authorization has been APPROVED from 03/22/22 to 10/21/22.    Authorization # [id number]

## 2022-03-25 NOTE — Telephone Encounter (Signed)
Informed pt that she was approved for the [facility] and advised her to call her pharmacy to fill it

## 2022-04-05 ENCOUNTER — Other Ambulatory Visit: Payer: Self-pay | Admitting: Family Medicine

## 2022-04-05 DIAGNOSIS — F411 Generalized anxiety disorder: Secondary | ICD-10-CM

## 2022-04-12 ENCOUNTER — Ambulatory Visit (INDEPENDENT_AMBULATORY_CARE_PROVIDER_SITE_OTHER): Payer: Managed Care, Other (non HMO)

## 2022-04-12 ENCOUNTER — Ambulatory Visit: Payer: Managed Care, Other (non HMO) | Admitting: Orthopaedic Surgery

## 2022-04-12 ENCOUNTER — Encounter: Payer: Self-pay | Admitting: Orthopaedic Surgery

## 2022-04-12 DIAGNOSIS — G8929 Other chronic pain: Secondary | ICD-10-CM

## 2022-04-12 DIAGNOSIS — M25562 Pain in left knee: Secondary | ICD-10-CM | POA: Diagnosis not present

## 2022-04-12 DIAGNOSIS — M1712 Unilateral primary osteoarthritis, left knee: Secondary | ICD-10-CM

## 2022-04-12 MED ORDER — LIDOCAINE HCL 1 % IJ SOLN
2.0000 mL | INTRAMUSCULAR | Status: AC | PRN
  Administered 2022-04-12: 2 mL

## 2022-04-12 MED ORDER — BUPIVACAINE HCL 0.5 % IJ SOLN
2.0000 mL | INTRAMUSCULAR | Status: AC | PRN
  Administered 2022-04-12: 2 mL via INTRA_ARTICULAR

## 2022-04-12 MED ORDER — METHYLPREDNISOLONE ACETATE 40 MG/ML IJ SUSP
40.0000 mg | INTRAMUSCULAR | Status: AC | PRN
  Administered 2022-04-12: 40 mg via INTRA_ARTICULAR

## 2022-04-12 NOTE — Progress Notes (Addendum)
Office Visit Note   Patient: Erin Williamson           Date of Birth: 08/23/1960           MRN: [id number] Visit Date: 04/12/2022              Requested by: Midge Minium, MD [address] [place],  [place] [address] PCP: Midge Minium, MD   Assessment & Plan: Visit Diagnoses:  1. Primary osteoarthritis of left knee     Plan: Impression is moderately severe left knee osteoarthritis.  X-rays reviewed with the patient in detail.  Disease process and treatment options were explained.  She has tried various oral medications but has not tried any injections.  We did a left knee injection today.  She tolerated this well.  Oral NSAIDs have not helped.  Pain is limiting daily activities.  We will also set her up with a medial unloader brace and we gave her information on viscosupplementation.  I given her my card.  Follow-up as needed.  Follow-Up Instructions: No follow-ups on file.   Orders:  Orders Placed This Encounter  Procedures   Large Joint Inj: L knee   XR KNEE 3 VIEW LEFT   Meds ordered this encounter  Medications   bupivacaine (MARCAINE) 0.5 % (with pres) injection 2 mL   lidocaine (XYLOCAINE) 1 % (with pres) injection 2 mL   methylPREDNISolone acetate (DEPO-MEDROL) injection 40 mg      Procedures: Large Joint Inj: L knee on 04/12/2022 9:59 AM Details: 22 G needle Medications: 2 mL bupivacaine 0.5 %; 2 mL lidocaine 1 %; 40 mg methylPREDNISolone acetate 40 MG/ML Outcome: tolerated well, no immediate complications Patient was prepped and draped in the usual sterile fashion.       Clinical Data: No additional findings.   Subjective: Chief Complaint  Patient presents with   Left Knee - Pain    HPI Patient is a 61 year old female here with her sister today who comes in for worsening left knee pain with frequent giving way.  She is a Optician, dispensing.  She has been prescribed meloxicam in the past.  She reports pain but her main complaint  is that the knee is giving way and she is fearful of falling.  Review of Systems  Constitutional: Negative.   HENT: Negative.    Eyes: Negative.   Respiratory: Negative.    Cardiovascular: Negative.   Endocrine: Negative.   Musculoskeletal: Negative.   Neurological: Negative.   Hematological: Negative.   Psychiatric/Behavioral: Negative.    All other systems reviewed and are negative.    Objective: Vital Signs: There were no vitals taken for this visit.  Physical Exam Vitals and nursing note reviewed.  Constitutional:      Appearance: She is well-developed.  HENT:     Head: Atraumatic.     Nose: Nose normal.  Eyes:     Extraocular Movements: Extraocular movements intact.  Cardiovascular:     Pulses: Normal pulses.  Pulmonary:     Effort: Pulmonary effort is normal.  Abdominal:     Palpations: Abdomen is soft.  Musculoskeletal:     Cervical back: Neck supple.  Skin:    General: Skin is warm.     Capillary Refill: Capillary refill takes less than 2 seconds.  Neurological:     Mental Status: She is alert and oriented to person, place, and time. Mental status is at baseline.  Psychiatric:        Behavior:  Behavior normal.        Thought Content: Thought content normal.        Judgment: Judgment normal.     Ortho Exam Examination of the left knee shows trace effusion.  No medial joint line tenderness.  1+ patellofemoral crepitus with range of motion.  Collaterals and cruciates are stable. Specialty Comments:  No specialty comments available.  Imaging: No results found.   PMFS History: Patient Active Problem List   Diagnosis Date Noted   Vitreous floaters of right eye 10/28/2021   HTN (hypertension) 05/22/2021   Endometrial thickening on ultrasound 04/26/2021   Physical exam 06/23/2019   Vitamin D deficiency 06/23/2019   Family history of colon cancer 12/21/2018   Generalized anxiety disorder 12/21/2018   Hyperlipidemia 12/21/2018   Obesity (BMI 30-39.9)  12/21/2018   Past Medical History:  Diagnosis Date   Arthritis    GERD (gastroesophageal reflux disease)    Hyperlipidemia    Hypertension    Migraine     Family History  Problem Relation Age of Onset   COPD Mother    Heart attack Mother    Heart disease Mother    Hyperlipidemia Mother    Hypertension Mother    Early death Father    Stroke Sister    Arthritis Sister    Colon cancer Sister    Mental illness Son    Rectal cancer Neg Hx    Stomach cancer Neg Hx    Esophageal cancer Neg Hx    Liver cancer Neg Hx    Pancreatic cancer Neg Hx     Past Surgical History:  Procedure Laterality Date   CESAREAN SECTION     1987 and 197   tummy tuck     Social History   Occupational History   Not on file  Tobacco Use   Smoking status: Former    Types: Cigarettes   Smokeless tobacco: Never  Vaping Use   Vaping Use: Never used  Substance and Sexual Activity   Alcohol use: Yes    Comment: occassional   Drug use: Never   Sexual activity: Not Currently    Birth control/protection: None

## 2022-04-16 ENCOUNTER — Telehealth: Payer: Self-pay | Admitting: Orthopaedic Surgery

## 2022-04-16 NOTE — Telephone Encounter (Signed)
Called. No answer. LMOM that the order was sent over. Someone with [facility] will be in touch with patient.  Also [employer] again about the brace. The original order was sent over on 04/12/2022.

## 2022-04-16 NOTE — Telephone Encounter (Signed)
Patient states that Dr Erlinda Hong was going to get her fitted for a brace and someone was supposed to call her.  [phone]

## 2022-04-20 ENCOUNTER — Telehealth: Payer: Self-pay | Admitting: Orthopedic Surgery

## 2022-04-20 NOTE — Telephone Encounter (Signed)
Orthopedic Telephone Call  Patient having a lot of pain in her left knee. Right knee is starting to bother her as well. Had an injection about 1 week ago into the left knee. Is able to move knee from 5-90 degrees without severe pain. Knee is no more swollen than normal. Has been able to walk around. Has not had any fevers. Wanted to see if she should proceed with the brace that she is going to get on 12/05. I told her she should as Dr. Erlinda Hong is trying to treat her with all non-operative modalities before considering a knee replacement. She wanted to make sure that if she does go through with knee replacement surgery that none of her ligaments or meniscus are torn prior to surgery. She inquired about an MRI. I explained that her meniscus is removed during a knee replacement and, depending on what knee replacement Dr. Erlinda Hong decides to do, her ACL and possibly her PCL will be removed. She has degenerative changes on her knee XR so I do not think a mensicus repair or meniscectomy would give her lasting relief if she really wants to pursue MRI to look at that as a possible cause of her pain. She has been using tylenol for pain relief. She has taken '500mg'$ . I told her she can take '1000mg'$  TID. She should ice her knee. She also has meloxicam that I told her to use as prescribed for this flare of pain. If she has further issues, she should call back.  Callie Fielding, MD Orthopedic Surgeon

## 2022-04-24 ENCOUNTER — Telehealth: Payer: Self-pay

## 2022-04-24 NOTE — Telephone Encounter (Signed)
Please advise 

## 2022-04-24 NOTE — Telephone Encounter (Signed)
Patient would like a CB from Dr. Erlinda Hong.  Patient did not state what she needed.  Cb# [phone].  Please advise.  Thank you.

## 2022-04-24 NOTE — Telephone Encounter (Signed)
Spoke to her.

## 2022-04-24 NOTE — Telephone Encounter (Signed)
noted 

## 2022-05-03 ENCOUNTER — Other Ambulatory Visit: Payer: Self-pay | Admitting: Family Medicine

## 2022-05-17 ENCOUNTER — Ambulatory Visit
Admission: RE | Admit: 2022-05-17 | Discharge: 2022-05-17 | Disposition: A | Discharge status: Home / Self Care | Payer: 59 | Source: Ambulatory Visit | Attending: Family Medicine | Admitting: Family Medicine

## 2022-05-17 DIAGNOSIS — Z1231 Encounter for screening mammogram for malignant neoplasm of breast: Secondary | ICD-10-CM

## 2022-05-22 ENCOUNTER — Telehealth: Payer: Self-pay

## 2022-05-22 ENCOUNTER — Other Ambulatory Visit: Payer: Self-pay | Admitting: Family Medicine

## 2022-05-22 DIAGNOSIS — R928 Other abnormal and inconclusive findings on diagnostic imaging of breast: Secondary | ICD-10-CM

## 2022-05-22 NOTE — Telephone Encounter (Signed)
Pt had mammo yesterday and is asking what this means in laymans terms

## 2022-05-22 NOTE — Telephone Encounter (Signed)
6:05 PM I attempted to call the patient, left her a voicemail.  Recommended she call back tomorrow to review information provided below or may be best to discuss the specific concerns with the radiologist or [facility] as they often will review those results with patients and plan.  They may also be able to answer questions a little further regarding upcoming studies.  Let me know if I can help further.  On review of the mammogram report,there were some possible asymmetries or different appearing tissue in both breasts.  They did not specify if those looked concerning or not concerning, hence the reason for further imaging for clarification.  It appears she is scheduled for diagnostic breast tomography and ultrasound, and in my understanding that will provide more detail to look at those areas to see if there are any concerns.

## 2022-05-23 NOTE — Telephone Encounter (Signed)
Pt has been informed and does have diagnostics scheduled for next week Friday 05/31/22

## 2022-05-29 ENCOUNTER — Other Ambulatory Visit: Payer: Self-pay | Admitting: Family Medicine

## 2022-05-29 ENCOUNTER — Encounter: Payer: Self-pay | Admitting: Family Medicine

## 2022-05-29 MED ORDER — LORAZEPAM 1 MG PO TABS: 0.5000 mg | ORAL_TABLET | Freq: Three times a day (TID) | ORAL | 0 refills | Status: DC | PRN

## 2022-05-29 NOTE — Telephone Encounter (Signed)
Lorazepam 1 mg LOV: 01/31/22 Last Refill:09/24/21 Upcoming appt: none

## 2022-05-31 ENCOUNTER — Ambulatory Visit
Admission: RE | Admit: 2022-05-31 | Discharge: 2022-05-31 | Disposition: A | Discharge status: Home / Self Care | Payer: Managed Care, Other (non HMO) | Source: Ambulatory Visit | Attending: Family Medicine | Admitting: Family Medicine

## 2022-05-31 DIAGNOSIS — R928 Other abnormal and inconclusive findings on diagnostic imaging of breast: Secondary | ICD-10-CM

## 2022-06-04 ENCOUNTER — Encounter: Payer: Self-pay | Admitting: Family Medicine

## 2022-06-25 ENCOUNTER — Encounter: Payer: Self-pay | Admitting: Orthopaedic Surgery

## 2022-06-25 ENCOUNTER — Other Ambulatory Visit: Payer: Self-pay

## 2022-06-25 ENCOUNTER — Ambulatory Visit (INDEPENDENT_AMBULATORY_CARE_PROVIDER_SITE_OTHER): Payer: Managed Care, Other (non HMO) | Admitting: Orthopaedic Surgery

## 2022-06-25 ENCOUNTER — Ambulatory Visit (INDEPENDENT_AMBULATORY_CARE_PROVIDER_SITE_OTHER): Payer: Managed Care, Other (non HMO)

## 2022-06-25 VITALS — Ht 63.0 in | Wt 189.0 lb

## 2022-06-25 DIAGNOSIS — M1712 Unilateral primary osteoarthritis, left knee: Secondary | ICD-10-CM

## 2022-06-25 NOTE — Progress Notes (Signed)
Office Visit Note   Patient: Erin Williamson           Date of Birth: [birth year]           MRN: [id number] Visit Date: 06/25/2022              Requested by: Midge Minium, MD [address] [place],  [place] [address] PCP: Midge Minium, MD   Assessment & Plan: Visit Diagnoses:  1. Primary osteoarthritis of left knee     Plan: Impression is severe left knee degenerative joint disease secondary to Osteoarthritis.  Bone on bone joint space narrowing is seen on radiographs with mild varus alignment.  At this point, conservative treatments fail to provide any significant relief and the pain is severely affecting ADLs and quality of life.  Based on treatment options, the patient has elected to move forward with a knee replacement.  We have discussed the surgical risks that include but are not limited to infection, DVT, leg length discrepancy, stiffness, numbness, tingling, incomplete relief of pain.  Recovery and prognosis were also reviewed.  [name] met with her today to confirm surgery date.  Anticoagulants: No antithrombotic Postop anticoagulation: Aspirin 81 mg Diabetic: No  Nickel allergy: Yes Prior DVT/PE: No Tobacco use: No Clearances needed for surgery: none Anticipated discharge dispo: Home   Follow-Up Instructions: No follow-ups on file.   Orders:  No orders of the defined types were placed in this encounter.  No orders of the defined types were placed in this encounter.     Procedures: No procedures performed   Clinical Data: No additional findings.   Subjective: Chief Complaint  Patient presents with   Left Knee - Pain    HPI Erin Williamson returns today for follow-up of severe left knee pain.  She has advanced DJD.  She has been wearing an unloader brace which helps but does not significantly improve her pain.  Review of Systems  Constitutional: Negative.   HENT: Negative.    Eyes: Negative.   Respiratory: Negative.    Cardiovascular:  Negative.   Endocrine: Negative.   Musculoskeletal: Negative.   Neurological: Negative.   Hematological: Negative.   Psychiatric/Behavioral: Negative.    All other systems reviewed and are negative.    Objective: Vital Signs: Ht '5\' 3"'$  (1.6 m)   Wt 189 lb (85.7 kg)   BMI 33.48 kg/m   Physical Exam Vitals and nursing note reviewed.  Constitutional:      Appearance: She is well-developed.  HENT:     Head: Atraumatic.     Nose: Nose normal.  Eyes:     Extraocular Movements: Extraocular movements intact.  Cardiovascular:     Pulses: Normal pulses.  Pulmonary:     Effort: Pulmonary effort is normal.  Abdominal:     Palpations: Abdomen is soft.  Musculoskeletal:     Cervical back: Neck supple.  Skin:    General: Skin is warm.     Capillary Refill: Capillary refill takes less than 2 seconds.  Neurological:     Mental Status: She is alert. Mental status is at baseline.  Psychiatric:        Behavior: Behavior normal.        Thought Content: Thought content normal.        Judgment: Judgment normal.     Ortho Exam Examination left knee shows varus alignment.  Medial joint line tenderness.  2+ patellofemoral crepitus. Specialty Comments:  No specialty comments available.  Imaging: No results  found.   PMFS History: Patient Active Problem List   Diagnosis Date Noted   Vitreous floaters of right eye 10/28/2021   HTN (hypertension) 05/22/2021   Endometrial thickening on ultrasound 04/26/2021   Physical exam 06/23/2019   Vitamin D deficiency 06/23/2019   Family history of colon cancer 12/21/2018   Generalized anxiety disorder 12/21/2018   Hyperlipidemia 12/21/2018   Obesity (BMI 30-39.9) 12/21/2018   Past Medical History:  Diagnosis Date   Arthritis    GERD (gastroesophageal reflux disease)    Hyperlipidemia    Hypertension    Migraine     Family History  Problem Relation Age of Onset   COPD Mother    Heart attack Mother    Heart disease Mother     Hyperlipidemia Mother    Hypertension Mother    Early death Father    Stroke Sister    Arthritis Sister    Colon cancer Sister    Mental illness Son    Rectal cancer Neg Hx    Stomach cancer Neg Hx    Esophageal cancer Neg Hx    Liver cancer Neg Hx    Pancreatic cancer Neg Hx     Past Surgical History:  Procedure Laterality Date   CESAREAN SECTION     1987 and 197   tummy tuck     Social History   Occupational History   Not on file  Tobacco Use   Smoking status: Former    Types: Cigarettes   Smokeless tobacco: Never  Vaping Use   Vaping Use: Never used  Substance and Sexual Activity   Alcohol use: Yes    Comment: occassional   Drug use: Never   Sexual activity: Not Currently    Birth control/protection: None

## 2022-06-27 ENCOUNTER — Telehealth: Payer: Self-pay | Admitting: Orthopaedic Surgery

## 2022-06-27 NOTE — Telephone Encounter (Signed)
[employer] forms received. To [employer]

## 2022-07-01 ENCOUNTER — Encounter: Payer: Self-pay | Admitting: Family Medicine

## 2022-07-01 MED ORDER — TRAZODONE HCL 50 MG PO TABS: 25.0000 mg | ORAL_TABLET | Freq: Every evening | ORAL | 3 refills | Status: DC | PRN

## 2022-07-04 ENCOUNTER — Ambulatory Visit: Payer: Managed Care, Other (non HMO) | Admitting: Orthopaedic Surgery

## 2022-07-15 ENCOUNTER — Other Ambulatory Visit: Payer: Self-pay | Admitting: Physician Assistant

## 2022-07-15 MED ORDER — DOCUSATE SODIUM 100 MG PO CAPS: 100.0000 mg | ORAL_CAPSULE | Freq: Every day | ORAL | 2 refills | Status: AC | PRN

## 2022-07-15 MED ORDER — OXYCODONE-ACETAMINOPHEN 5-325 MG PO TABS: 1.0000 | ORAL_TABLET | Freq: Four times a day (QID) | ORAL | 0 refills | Status: DC | PRN

## 2022-07-15 MED ORDER — ONDANSETRON HCL 4 MG PO TABS: 4.0000 mg | ORAL_TABLET | Freq: Three times a day (TID) | ORAL | 0 refills | Status: DC | PRN

## 2022-07-15 MED ORDER — ASPIRIN 81 MG PO TBEC: 81.0000 mg | DELAYED_RELEASE_TABLET | Freq: Two times a day (BID) | ORAL | 0 refills | Status: AC

## 2022-07-15 MED ORDER — METHOCARBAMOL 750 MG PO TABS: 750.0000 mg | ORAL_TABLET | Freq: Two times a day (BID) | ORAL | 2 refills | Status: DC | PRN

## 2022-07-15 NOTE — Progress Notes (Signed)
Surgical Instructions    Your procedure is scheduled on February 27.  Report to [facility] Main Entrance "A" at 7:23 A.M., then check in with the Admitting office.  Call this number if you have problems the morning of surgery:  [phone]   If you have any questions prior to your surgery date call [phone]: Open Monday-Friday 8am-4pm If you experience any cold or flu symptoms such as cough, fever, chills, shortness of breath, etc. between now and your scheduled surgery, please notify us at the above number     Remember:  Do not eat after midnight the night before your surgery  You may drink clear liquids until 6:53am the morning of your surgery.   Clear liquids allowed are: Water, Non-Citrus Juices (without pulp), Carbonated Beverages, Clear Tea, Black Coffee ONLY (NO MILK, CREAM OR POWDERED CREAMER of any kind), and Gatorade   The night before surgery:  No food after midnight. ONLY clear liquids after midnight  The day of surgery (if you do NOT have diabetes):  Drink ONE (1) Pre-Surgery Clear Ensure by 6:53AM the morning of surgery. Drink in one sitting. Do not sip.  This drink was given to you during your hospital  pre-op appointment visit.  Nothing else to drink after completing the  Pre-Surgery Clear Ensure.         If you have questions, please contact your surgeon's office.      Take these medicines the morning of surgery with A SIP OF WATER:  amLODipine (NORVASC)   If needed you may take:  acetaminophen (TYLENOL)  LORazepam (ATIVAN)   HOLD Semaglutide-Weight Management (WEGOVY) 7 DAYS PRIOR TO SURGERY.  As of today, STOP taking any Aspirin (unless otherwise instructed by your surgeon) Aleve, Naproxen, Ibuprofen, Motrin, Advil, Goody's, BC's, all herbal medications, fish oil, and all vitamins.           Do not wear jewelry or makeup. Do not wear lotions, powders, perfumes/cologne or deodorant. Do not shave 48 hours prior to surgery.  Men may shave face and  neck. Do not bring valuables to the hospital. Do not wear nail polish, gel polish, artificial nails, or any other type of covering on natural nails (fingers and toes) If you have artificial nails or gel coating that need to be removed by a nail salon, please have this removed prior to surgery. Artificial nails or gel coating may interfere with anesthesia's ability to adequately monitor your vital signs.  [facility] is not responsible for any belongings or valuables.    Do NOT Smoke (Tobacco/Vaping)  24 hours prior to your procedure  If you use a CPAP at night, you may bring your mask for your overnight stay.   Contacts, glasses, hearing aids, dentures or partials may not be worn into surgery, please bring cases for these belongings   For patients admitted to the hospital, discharge time will be determined by your treatment team.   Patients discharged the day of surgery will not be allowed to drive home, and someone needs to stay with them for 24 hours.   SURGICAL WAITING ROOM VISITATION Patients having surgery or a procedure may have no more than 2 support people in the waiting area - these visitors may rotate.   Children under the age of 68 must have an adult with them who is not the patient. If the patient needs to stay at the hospital during part of their recovery, the visitor guidelines for inpatient rooms apply. Pre-op nurse will coordinate an appropriate  time for 1 support person to accompany patient in pre-op.  This support person may not rotate.   Please refer to [email or web address][employer]hu for the visitor guidelines for Inpatients (after your surgery is over and you are in a regular room).    Special instructions:    Oral Hygiene is also important to reduce your risk of infection.  Remember - BRUSH YOUR TEETH THE MORNING OF SURGERY WITH YOUR REGULAR TOOTHPASTE   [facility]- Preparing For Surgery  Before surgery, you can play an  important role. Because skin is not sterile, your skin needs to be as free of germs as possible. You can reduce the number of germs on your skin by washing with CHG (chlorahexidine gluconate) Soap before surgery.  CHG is an antiseptic cleaner which kills germs and bonds with the skin to continue killing germs even after washing.     Please do not use if you have an allergy to CHG or antibacterial soaps. If your skin becomes reddened/irritated stop using the CHG.  Do not shave (including legs and underarms) for at least 48 hours prior to first CHG shower. It is OK to shave your face.  Please follow these instructions carefully.     Shower the NIGHT BEFORE SURGERY and the MORNING OF SURGERY with CHG Soap.   If you chose to wash your hair, wash your hair first as usual with your normal shampoo. After you shampoo, rinse your hair and body thoroughly to remove the shampoo.  Then [employer] and genitals (private parts) with your normal soap and rinse thoroughly to remove soap.  After that Use CHG Soap as you would any other liquid soap. You can apply CHG directly to the skin and wash gently with a scrungie or a clean washcloth.   Apply the CHG Soap to your body ONLY FROM THE NECK DOWN.  Do not use on open wounds or open sores. Avoid contact with your eyes, ears, mouth and genitals (private parts). Wash Face and genitals (private parts)  with your normal soap.   Wash thoroughly, paying special attention to the area where your surgery will be performed.  Thoroughly rinse your body with warm water from the neck down.  DO NOT shower/wash with your normal soap after using and rinsing off the CHG Soap.  Pat yourself dry with a CLEAN TOWEL.  Wear CLEAN PAJAMAS to bed the night before surgery  Place CLEAN SHEETS on your bed the night before your surgery  DO NOT SLEEP WITH PETS.   Day of Surgery:  Take a shower with CHG soap. Wear Clean/Comfortable clothing the morning of surgery Do not apply any  deodorants/lotions.   Remember to brush your teeth WITH YOUR REGULAR TOOTHPASTE.    If you received a COVID test during your pre-op visit, it is requested that you wear a mask when out in public, stay away from anyone that may not be feeling well, and notify your surgeon if you develop symptoms. If you have been in contact with anyone that has tested positive in the last 10 days, please notify your surgeon.    Please read over the following fact sheets that you were given.

## 2022-07-16 ENCOUNTER — Encounter (HOSPITAL_COMMUNITY): Payer: Self-pay

## 2022-07-16 ENCOUNTER — Other Ambulatory Visit: Payer: Self-pay

## 2022-07-16 ENCOUNTER — Encounter (HOSPITAL_COMMUNITY)
Admission: RE | Admit: 2022-07-16 | Discharge: 2022-07-16 | Disposition: A | Discharge status: Home / Self Care | Payer: 59 | Source: Ambulatory Visit | Attending: Orthopaedic Surgery | Admitting: Orthopaedic Surgery

## 2022-07-16 VITALS — BP 148/84 | HR 80 | Temp 98.2°F | Resp 18 | Ht 63.0 in | Wt 191.8 lb

## 2022-07-16 DIAGNOSIS — M1712 Unilateral primary osteoarthritis, left knee: Secondary | ICD-10-CM | POA: Diagnosis not present

## 2022-07-16 DIAGNOSIS — Z01818 Encounter for other preprocedural examination: Secondary | ICD-10-CM

## 2022-07-16 DIAGNOSIS — I10 Essential (primary) hypertension: Secondary | ICD-10-CM | POA: Insufficient documentation

## 2022-07-16 HISTORY — DX: Nausea with vomiting, unspecified: R11.2

## 2022-07-16 HISTORY — DX: Other specified postprocedural states: Z98.890

## 2022-07-16 HISTORY — DX: Unspecified osteoarthritis, unspecified site: M19.90

## 2022-07-16 HISTORY — DX: Anxiety disorder, unspecified: F41.9

## 2022-07-16 LAB — CBC
HCT: 41.9 % (ref 36.0–46.0)
Hemoglobin: 13.7 g/dL (ref 12.0–15.0)
MCH: 27.2 pg (ref 26.0–34.0)
MCHC: 32.7 g/dL (ref 30.0–36.0)
MCV: 83.3 fL (ref 80.0–100.0)
Platelets: 268 10*3/uL (ref 150–400)
RBC: 5.03 MIL/uL (ref 3.87–5.11)
RDW: 13.8 % (ref 11.5–15.5)
WBC: 6.1 10*3/uL (ref 4.0–10.5)
nRBC: 0 % (ref 0.0–0.2)

## 2022-07-16 LAB — BASIC METABOLIC PANEL
Anion gap: 7 (ref 5–15)
BUN: 14 mg/dL (ref 8–23)
CO2: 30 mmol/L (ref 22–32)
Calcium: 9.7 mg/dL (ref 8.9–10.3)
Chloride: 101 mmol/L (ref 98–111)
Creatinine, Ser: 0.72 mg/dL (ref 0.44–1.00)
GFR, Estimated: >60 mL/min (ref >60)
Glucose, Bld: 93 mg/dL (ref 70–99)
Potassium: 3.8 mmol/L (ref 3.5–5.1)
Sodium: 138 mmol/L (ref 135–145)

## 2022-07-16 LAB — SURGICAL PCR SCREEN
MRSA, PCR: NEGATIVE
Staphylococcus aureus: NEGATIVE

## 2022-07-16 NOTE — Progress Notes (Signed)
PCP - Dr. Annye Asa Cardiologist - denies  PPM/ICD - denies   Chest x-ray - 04/03/21 EKG - 07/16/22 Stress Test - 10+ years ago per pt, normal per pt ECHO - denies Cardiac Cath - denies  Sleep Study - denies  DM- denies  ASA/Blood Thinner Instructions: n/a   ERAS Protcol - yes PRE-SURGERY Ensure given at PAT  COVID TEST- n/a   Anesthesia review: no  Patient denies shortness of breath, fever, cough and chest pain at PAT appointment   All instructions explained to the patient, with a verbal understanding of the material. Patient agrees to go over the instructions while at home for a better understanding.  The opportunity to ask questions was provided.

## 2022-07-18 ENCOUNTER — Other Ambulatory Visit: Payer: Self-pay

## 2022-07-18 ENCOUNTER — Telehealth: Payer: Self-pay

## 2022-07-18 DIAGNOSIS — M1712 Unilateral primary osteoarthritis, left knee: Secondary | ICD-10-CM

## 2022-07-18 NOTE — Telephone Encounter (Signed)
Spoke with patient. She is scheduled for a left total knee arthroplasty next Tuesday 07/23/2022. She has [employer] and therefore HHPT is not an option. She agrees to going to outpatient PT. She wants to go to a place in [place]. She is going to call us with the name of the place and we will then enter the order.

## 2022-07-18 NOTE — Telephone Encounter (Signed)
Ordered PT

## 2022-07-22 DIAGNOSIS — M1712 Unilateral primary osteoarthritis, left knee: Secondary | ICD-10-CM | POA: Insufficient documentation

## 2022-07-22 MED ORDER — TRANEXAMIC ACID 1000 MG/10ML IV SOLN
2000.0000 mg | INTRAVENOUS | Status: DC
  Filled 2022-07-22: qty 20

## 2022-07-23 ENCOUNTER — Ambulatory Visit (HOSPITAL_COMMUNITY): Payer: Managed Care, Other (non HMO) | Admitting: Certified Registered Nurse Anesthetist

## 2022-07-23 ENCOUNTER — Observation Stay (HOSPITAL_COMMUNITY)
Admission: RE | Admit: 2022-07-23 | Discharge: 2022-07-24 | Disposition: A | Discharge status: Home / Self Care | Payer: Managed Care, Other (non HMO) | Attending: Orthopaedic Surgery | Admitting: Orthopaedic Surgery

## 2022-07-23 ENCOUNTER — Other Ambulatory Visit: Payer: Self-pay | Admitting: Family Medicine

## 2022-07-23 ENCOUNTER — Ambulatory Visit (HOSPITAL_BASED_OUTPATIENT_CLINIC_OR_DEPARTMENT_OTHER): Payer: Managed Care, Other (non HMO) | Admitting: Certified Registered Nurse Anesthetist

## 2022-07-23 ENCOUNTER — Observation Stay (HOSPITAL_COMMUNITY): Payer: Managed Care, Other (non HMO)

## 2022-07-23 ENCOUNTER — Encounter (HOSPITAL_COMMUNITY)
Admission: RE | Disposition: A | Discharge status: Home / Self Care | Payer: Self-pay | Source: Home / Self Care | Attending: Orthopaedic Surgery

## 2022-07-23 ENCOUNTER — Other Ambulatory Visit: Payer: Self-pay

## 2022-07-23 ENCOUNTER — Encounter (HOSPITAL_COMMUNITY): Payer: Self-pay | Admitting: Orthopaedic Surgery

## 2022-07-23 DIAGNOSIS — Z7982 Long term (current) use of aspirin: Secondary | ICD-10-CM | POA: Diagnosis not present

## 2022-07-23 DIAGNOSIS — Z96652 Presence of left artificial knee joint: Secondary | ICD-10-CM

## 2022-07-23 DIAGNOSIS — I10 Essential (primary) hypertension: Secondary | ICD-10-CM | POA: Insufficient documentation

## 2022-07-23 DIAGNOSIS — Z79899 Other long term (current) drug therapy: Secondary | ICD-10-CM | POA: Insufficient documentation

## 2022-07-23 DIAGNOSIS — Z87891 Personal history of nicotine dependence: Secondary | ICD-10-CM | POA: Diagnosis not present

## 2022-07-23 DIAGNOSIS — M1712 Unilateral primary osteoarthritis, left knee: Secondary | ICD-10-CM

## 2022-07-23 HISTORY — PX: TOTAL KNEE ARTHROPLASTY: SHX125

## 2022-07-23 SURGERY — ARTHROPLASTY, KNEE, TOTAL
Anesthesia: Spinal | Site: Knee | Laterality: Left

## 2022-07-23 MED ORDER — ONDANSETRON HCL 4 MG/2ML IJ SOLN: 4.0000 mg | Freq: Four times a day (QID) | INTRAMUSCULAR | Status: DC | PRN

## 2022-07-23 MED ORDER — DOCUSATE SODIUM 100 MG PO CAPS
100.0000 mg | ORAL_CAPSULE | Freq: Two times a day (BID) | ORAL | Status: DC
  Administered 2022-07-23: 100 mg via ORAL
  Filled 2022-07-23: qty 1

## 2022-07-23 MED ORDER — FERROUS SULFATE 325 (65 FE) MG PO TABS
325.0000 mg | ORAL_TABLET | Freq: Three times a day (TID) | ORAL | Status: DC
  Administered 2022-07-23: 325 mg via ORAL
  Filled 2022-07-23: qty 1

## 2022-07-23 MED ORDER — POVIDONE-IODINE 10 % EX SWAB
2.0000 | Freq: Once | CUTANEOUS | Status: AC
  Administered 2022-07-23: 2 via TOPICAL

## 2022-07-23 MED ORDER — TRANEXAMIC ACID-NACL 1000-0.7 MG/100ML-% IV SOLN
1000.0000 mg | INTRAVENOUS | Status: AC
  Administered 2022-07-23: 1000 mg via INTRAVENOUS
  Filled 2022-07-23: qty 100

## 2022-07-23 MED ORDER — CEFAZOLIN SODIUM-DEXTROSE 2-4 GM/100ML-% IV SOLN
2.0000 g | Freq: Four times a day (QID) | INTRAVENOUS | Status: AC
  Administered 2022-07-23 (×2): 2 g via INTRAVENOUS
  Filled 2022-07-23 (×2): qty 100

## 2022-07-23 MED ORDER — PROPOFOL 10 MG/ML IV BOLUS
INTRAVENOUS | Status: DC | PRN
  Administered 2022-07-23: 20 mg via INTRAVENOUS

## 2022-07-23 MED ORDER — PHENOL 1.4 % MT LIQD: 1.0000 | OROMUCOSAL | Status: DC | PRN

## 2022-07-23 MED ORDER — FENTANYL CITRATE (PF) 100 MCG/2ML IJ SOLN
INTRAMUSCULAR | Status: AC
  Administered 2022-07-23: 100 ug
  Filled 2022-07-23: qty 2

## 2022-07-23 MED ORDER — ACETAMINOPHEN 325 MG PO TABS: 325.0000 mg | ORAL_TABLET | Freq: Four times a day (QID) | ORAL | Status: DC | PRN

## 2022-07-23 MED ORDER — METHOCARBAMOL 500 MG PO TABS
500.0000 mg | ORAL_TABLET | Freq: Four times a day (QID) | ORAL | Status: DC | PRN
  Administered 2022-07-23: 500 mg via ORAL
  Filled 2022-07-23 (×2): qty 1

## 2022-07-23 MED ORDER — 0.9 % SODIUM CHLORIDE (POUR BTL) OPTIME
TOPICAL | Status: DC | PRN
  Administered 2022-07-23: 1000 mL

## 2022-07-23 MED ORDER — AMLODIPINE BESYLATE 5 MG PO TABS
5.0000 mg | ORAL_TABLET | Freq: Every day | ORAL | Status: DC
  Administered 2022-07-23: 5 mg via ORAL
  Filled 2022-07-23: qty 1

## 2022-07-23 MED ORDER — ONDANSETRON HCL 4 MG PO TABS: 4.0000 mg | ORAL_TABLET | Freq: Four times a day (QID) | ORAL | Status: DC | PRN

## 2022-07-23 MED ORDER — METHOCARBAMOL 1000 MG/10ML IJ SOLN: 500.0000 mg | Freq: Four times a day (QID) | INTRAVENOUS | Status: DC | PRN

## 2022-07-23 MED ORDER — BUPIVACAINE IN DEXTROSE 0.75-8.25 % IT SOLN
INTRATHECAL | Status: DC | PRN
  Administered 2022-07-23: 1.4 mL via INTRATHECAL

## 2022-07-23 MED ORDER — TRANEXAMIC ACID 1000 MG/10ML IV SOLN
INTRAVENOUS | Status: DC | PRN
  Administered 2022-07-23: 2000 mg via TOPICAL

## 2022-07-23 MED ORDER — CHLORHEXIDINE GLUCONATE 0.12 % MT SOLN
15.0000 mL | Freq: Once | OROMUCOSAL | Status: AC
  Administered 2022-07-23: 15 mL via OROMUCOSAL
  Filled 2022-07-23: qty 15

## 2022-07-23 MED ORDER — SODIUM CHLORIDE 0.9 % IR SOLN
Status: DC | PRN
  Administered 2022-07-23: 1000 mL

## 2022-07-23 MED ORDER — SODIUM CHLORIDE 0.9 % IV SOLN: INTRAVENOUS | Status: DC

## 2022-07-23 MED ORDER — ONDANSETRON HCL 4 MG/2ML IJ SOLN
INTRAMUSCULAR | Status: DC | PRN
  Administered 2022-07-23: 4 mg via INTRAVENOUS

## 2022-07-23 MED ORDER — OXYCODONE HCL 5 MG PO TABS: 5.0000 mg | ORAL_TABLET | Freq: Once | ORAL | Status: DC | PRN

## 2022-07-23 MED ORDER — OXYCODONE HCL 5 MG PO TABS: 10.0000 mg | ORAL_TABLET | ORAL | Status: DC | PRN

## 2022-07-23 MED ORDER — VANCOMYCIN HCL 1000 MG IV SOLR
INTRAVENOUS | Status: DC | PRN
  Administered 2022-07-23: 1000 mg

## 2022-07-23 MED ORDER — METOCLOPRAMIDE HCL 5 MG PO TABS: 5.0000 mg | ORAL_TABLET | Freq: Three times a day (TID) | ORAL | Status: DC | PRN

## 2022-07-23 MED ORDER — PROPOFOL 500 MG/50ML IV EMUL
INTRAVENOUS | Status: DC | PRN
  Administered 2022-07-23: 100 ug/kg/min via INTRAVENOUS

## 2022-07-23 MED ORDER — ACETAMINOPHEN 500 MG PO TABS
1000.0000 mg | ORAL_TABLET | Freq: Four times a day (QID) | ORAL | Status: AC
  Administered 2022-07-23 – 2022-07-24 (×4): 1000 mg via ORAL
  Filled 2022-07-23 (×4): qty 2

## 2022-07-23 MED ORDER — PHENYLEPHRINE HCL-NACL 20-0.9 MG/250ML-% IV SOLN
INTRAVENOUS | Status: DC | PRN
  Administered 2022-07-23: 50 ug/min via INTRAVENOUS

## 2022-07-23 MED ORDER — MIDAZOLAM HCL 2 MG/2ML IJ SOLN
INTRAMUSCULAR | Status: AC
  Administered 2022-07-23: 2 mg
  Filled 2022-07-23: qty 2

## 2022-07-23 MED ORDER — MIDAZOLAM HCL 2 MG/2ML IJ SOLN
INTRAMUSCULAR | Status: DC | PRN
  Administered 2022-07-23: 2 mg via INTRAVENOUS

## 2022-07-23 MED ORDER — STERILE WATER FOR IRRIGATION IR SOLN
Status: DC | PRN
  Administered 2022-07-23: 1000 mL

## 2022-07-23 MED ORDER — DEXAMETHASONE SODIUM PHOSPHATE 10 MG/ML IJ SOLN
INTRAMUSCULAR | Status: DC | PRN
  Administered 2022-07-23: 10 mg

## 2022-07-23 MED ORDER — CEFAZOLIN SODIUM-DEXTROSE 2-4 GM/100ML-% IV SOLN
2.0000 g | INTRAVENOUS | Status: AC
  Administered 2022-07-23: 2 g via INTRAVENOUS
  Filled 2022-07-23: qty 100

## 2022-07-23 MED ORDER — ROPIVACAINE HCL 5 MG/ML IJ SOLN
INTRAMUSCULAR | Status: DC | PRN
  Administered 2022-07-23 (×6): 5 mL via PERINEURAL

## 2022-07-23 MED ORDER — DEXAMETHASONE SODIUM PHOSPHATE 10 MG/ML IJ SOLN
INTRAMUSCULAR | Status: DC | PRN
  Administered 2022-07-23: 10 mg via INTRAVENOUS

## 2022-07-23 MED ORDER — LACTATED RINGERS IV SOLN: INTRAVENOUS | Status: DC

## 2022-07-23 MED ORDER — MEPERIDINE HCL 25 MG/ML IJ SOLN: 6.2500 mg | INTRAMUSCULAR | Status: DC | PRN

## 2022-07-23 MED ORDER — CLONIDINE HCL (ANALGESIA) 100 MCG/ML EP SOLN
EPIDURAL | Status: DC | PRN
  Administered 2022-07-23: 100 ug

## 2022-07-23 MED ORDER — PRONTOSAN WOUND IRRIGATION OPTIME
TOPICAL | Status: DC | PRN
  Administered 2022-07-23: 350 mL

## 2022-07-23 MED ORDER — HYDROMORPHONE HCL 1 MG/ML IJ SOLN: 0.5000 mg | INTRAMUSCULAR | Status: DC | PRN

## 2022-07-23 MED ORDER — TRANEXAMIC ACID-NACL 1000-0.7 MG/100ML-% IV SOLN
1000.0000 mg | Freq: Once | INTRAVENOUS | Status: AC
  Administered 2022-07-23: 1000 mg via INTRAVENOUS
  Filled 2022-07-23: qty 100

## 2022-07-23 MED ORDER — ACETAMINOPHEN 160 MG/5ML PO SOLN: 325.0000 mg | ORAL | Status: DC | PRN

## 2022-07-23 MED ORDER — MENTHOL 3 MG MT LOZG: 1.0000 | LOZENGE | OROMUCOSAL | Status: DC | PRN

## 2022-07-23 MED ORDER — ONDANSETRON HCL 4 MG/2ML IJ SOLN: 4.0000 mg | Freq: Once | INTRAMUSCULAR | Status: DC | PRN

## 2022-07-23 MED ORDER — VANCOMYCIN HCL 1000 MG IV SOLR
INTRAVENOUS | Status: AC
  Filled 2022-07-23: qty 20

## 2022-07-23 MED ORDER — OXYCODONE HCL 5 MG PO TABS: 5.0000 mg | ORAL_TABLET | ORAL | Status: DC | PRN

## 2022-07-23 MED ORDER — OXYCODONE HCL ER 10 MG PO T12A
10.0000 mg | EXTENDED_RELEASE_TABLET | Freq: Two times a day (BID) | ORAL | Status: DC
  Administered 2022-07-23 (×2): 10 mg via ORAL
  Filled 2022-07-23 (×2): qty 1

## 2022-07-23 MED ORDER — ASPIRIN 81 MG PO CHEW
81.0000 mg | CHEWABLE_TABLET | Freq: Two times a day (BID) | ORAL | Status: DC
  Administered 2022-07-23: 81 mg via ORAL
  Filled 2022-07-23: qty 1

## 2022-07-23 MED ORDER — MIDAZOLAM HCL 2 MG/2ML IJ SOLN
INTRAMUSCULAR | Status: AC
  Filled 2022-07-23: qty 2

## 2022-07-23 MED ORDER — KETOROLAC TROMETHAMINE 15 MG/ML IJ SOLN
15.0000 mg | Freq: Four times a day (QID) | INTRAMUSCULAR | Status: AC
  Administered 2022-07-23 (×3): 15 mg via INTRAVENOUS
  Filled 2022-07-23 (×3): qty 1

## 2022-07-23 MED ORDER — ORAL CARE MOUTH RINSE: 15.0000 mL | Freq: Once | OROMUCOSAL | Status: AC

## 2022-07-23 MED ORDER — FENTANYL CITRATE (PF) 100 MCG/2ML IJ SOLN: 25.0000 ug | INTRAMUSCULAR | Status: DC | PRN

## 2022-07-23 MED ORDER — BUPIVACAINE-MELOXICAM ER 400-12 MG/14ML IJ SOLN
INTRAMUSCULAR | Status: DC | PRN
  Administered 2022-07-23: 400 mg

## 2022-07-23 MED ORDER — DEXAMETHASONE SODIUM PHOSPHATE 10 MG/ML IJ SOLN: 10.0000 mg | Freq: Once | INTRAMUSCULAR | Status: DC

## 2022-07-23 MED ORDER — METOCLOPRAMIDE HCL 5 MG/ML IJ SOLN: 5.0000 mg | Freq: Three times a day (TID) | INTRAMUSCULAR | Status: DC | PRN

## 2022-07-23 MED ORDER — ACETAMINOPHEN 325 MG PO TABS: 325.0000 mg | ORAL_TABLET | ORAL | Status: DC | PRN

## 2022-07-23 MED ORDER — EPHEDRINE SULFATE-NACL 50-0.9 MG/10ML-% IV SOSY
PREFILLED_SYRINGE | INTRAVENOUS | Status: DC | PRN
  Administered 2022-07-23 (×2): 10 mg via INTRAVENOUS

## 2022-07-23 MED ORDER — KETOROLAC TROMETHAMINE 30 MG/ML IJ SOLN: 30.0000 mg | Freq: Once | INTRAMUSCULAR | Status: DC | PRN

## 2022-07-23 MED ORDER — OXYCODONE HCL 5 MG/5ML PO SOLN: 5.0000 mg | Freq: Once | ORAL | Status: DC | PRN

## 2022-07-23 MED ORDER — PHENYLEPHRINE 80 MCG/ML (10ML) SYRINGE FOR IV PUSH (FOR BLOOD PRESSURE SUPPORT)
PREFILLED_SYRINGE | INTRAVENOUS | Status: DC | PRN
  Administered 2022-07-23: 160 ug via INTRAVENOUS

## 2022-07-23 SURGICAL SUPPLY — 90 items
ADH SKN CLS APL DERMABOND .7 (GAUZE/BANDAGES/DRESSINGS) ×1
ALCOHOL 70% 16 OZ (MISCELLANEOUS) ×1 IMPLANT
BAG COUNTER SPONGE SURGICOUNT (BAG) IMPLANT
BAG DECANTER FOR FLEXI CONT (MISCELLANEOUS) ×1 IMPLANT
BAG SPNG CNTER NS LX DISP (BAG)
BANDAGE ESMARK 6X9 LF (GAUZE/BANDAGES/DRESSINGS) IMPLANT
BLADE SAG 18X100X1.27 (BLADE) ×1 IMPLANT
BLADE SAW SGTL 73X25 THK (BLADE) ×1 IMPLANT
BNDG CMPR 9X6 STRL LF SNTH (GAUZE/BANDAGES/DRESSINGS)
BNDG ESMARK 6X9 LF (GAUZE/BANDAGES/DRESSINGS)
BOWL SMART MIX CTS (DISPOSABLE) ×1 IMPLANT
BSPLAT TIB 5D D CMNT STM LT (Knees) ×1 IMPLANT
CEMENT BONE REFOBACIN R1X40 US (Cement) IMPLANT
CLSR STERI-STRIP ANTIMIC 1/2X4 (GAUZE/BANDAGES/DRESSINGS) ×2 IMPLANT
COMP FEM CMT CR STD 7 LT (Knees) ×1 IMPLANT
COMPONENT FEM CMT CR STD 7 LT (Knees) IMPLANT
CONT SPEC 4OZ CLIKSEAL STRL BL (MISCELLANEOUS) IMPLANT
COOLER ICEMAN CLASSIC (MISCELLANEOUS) ×1 IMPLANT
COVER SURGICAL LIGHT HANDLE (MISCELLANEOUS) ×1 IMPLANT
CUFF TOURN SGL QUICK 34 (TOURNIQUET CUFF) ×1
CUFF TOURN SGL QUICK 42 (TOURNIQUET CUFF) IMPLANT
CUFF TRNQT CYL 34X4.125X (TOURNIQUET CUFF) ×1 IMPLANT
DERMABOND ADVANCED .7 DNX12 (GAUZE/BANDAGES/DRESSINGS) ×1 IMPLANT
DRAPE EXTREMITY T 121X128X90 (DISPOSABLE) ×1 IMPLANT
DRAPE HALF SHEET 40X57 (DRAPES) ×1 IMPLANT
DRAPE INCISE IOBAN 66X45 STRL (DRAPES) ×1 IMPLANT
DRAPE ORTHO SPLIT 77X108 STRL (DRAPES)
DRAPE POUCH INSTRU U-SHP 10X18 (DRAPES) ×1 IMPLANT
DRAPE SURG ORHT 6 SPLT 77X108 (DRAPES) IMPLANT
DRAPE U-SHAPE 47X51 STRL (DRAPES) ×2 IMPLANT
DRSG AQUACEL AG ADV 3.5X10 (GAUZE/BANDAGES/DRESSINGS) ×1 IMPLANT
DRSG AQUACEL AG ADV 3.5X14 (GAUZE/BANDAGES/DRESSINGS) IMPLANT
DURAPREP 26ML APPLICATOR (WOUND CARE) ×3 IMPLANT
ELECT CAUTERY BLADE 6.4 (BLADE) ×1 IMPLANT
ELECT PENCIL ROCKER SW 15FT (MISCELLANEOUS) ×1 IMPLANT
ELECT REM PT RETURN 9FT ADLT (ELECTROSURGICAL) ×1
ELECTRODE REM PT RTRN 9FT ADLT (ELECTROSURGICAL) ×1 IMPLANT
GLOVE BIOGEL PI IND STRL 7.0 (GLOVE) ×2 IMPLANT
GLOVE BIOGEL PI IND STRL 7.5 (GLOVE) ×5 IMPLANT
GLOVE ECLIPSE 7.0 STRL STRAW (GLOVE) ×3 IMPLANT
GLOVE INDICATOR 7.0 STRL GRN (GLOVE) ×1 IMPLANT
GLOVE INDICATOR 7.5 STRL GRN (GLOVE) ×1 IMPLANT
GLOVE SURG SYN 7.5  E (GLOVE) ×2
GLOVE SURG SYN 7.5 E (GLOVE) ×2 IMPLANT
GLOVE SURG SYN 7.5 PF PI (GLOVE) ×2 IMPLANT
GLOVE SURG UNDER LTX SZ7.5 (GLOVE) ×2 IMPLANT
GLOVE SURG UNDER POLY LF SZ7 (GLOVE) ×2 IMPLANT
GOWN STRL REIN XL XLG (GOWN DISPOSABLE) ×1 IMPLANT
GOWN STRL REUS W/ TWL LRG LVL3 (GOWN DISPOSABLE) ×1 IMPLANT
GOWN STRL REUS W/TWL LRG LVL3 (GOWN DISPOSABLE) ×1
GOWN TOGA ZIPPER T7+ PEEL AWAY (MISCELLANEOUS) ×2 IMPLANT
HANDPIECE INTERPULSE COAX TIP (DISPOSABLE) ×1
HDLS TROCR DRIL PIN KNEE 75 (PIN) ×1
HOOD PEEL AWAY T7 (MISCELLANEOUS) ×1 IMPLANT
INSERT ARTISURF SZ 6-7 LT (Insert) IMPLANT
KIT BASIN OR (CUSTOM PROCEDURE TRAY) ×1 IMPLANT
KIT TURNOVER KIT B (KITS) ×1 IMPLANT
MANIFOLD NEPTUNE II (INSTRUMENTS) ×1 IMPLANT
MARKER SKIN DUAL TIP RULER LAB (MISCELLANEOUS) ×2 IMPLANT
NDL SPNL 18GX3.5 QUINCKE PK (NEEDLE) ×1 IMPLANT
NEEDLE SPNL 18GX3.5 QUINCKE PK (NEEDLE) ×1 IMPLANT
NS IRRIG 1000ML POUR BTL (IV SOLUTION) ×1 IMPLANT
PACK TOTAL JOINT (CUSTOM PROCEDURE TRAY) ×1 IMPLANT
PAD ARMBOARD 7.5X6 YLW CONV (MISCELLANEOUS) ×2 IMPLANT
PAD COLD SHLDR WRAP-ON (PAD) ×1 IMPLANT
PIN DRILL HDLS TROCAR 75 4PK (PIN) IMPLANT
SAW OSC TIP CART 19.5X105X1.3 (SAW) ×1 IMPLANT
SCREW FEMALE HEX FIX 25X2.5 (ORTHOPEDIC DISPOSABLE SUPPLIES) IMPLANT
SET HNDPC FAN SPRY TIP SCT (DISPOSABLE) ×1 IMPLANT
SOLUTION PRONTOSAN WOUND 350ML (IRRIGATION / IRRIGATOR) ×1 IMPLANT
STAPLER VISISTAT 35W (STAPLE) IMPLANT
STEM POLY PAT PLY 32M KNEE (Knees) IMPLANT
STEM TIBIA 5 DEG SZ D L KNEE (Knees) IMPLANT
SUCTION FRAZIER HANDLE 10FR (MISCELLANEOUS) ×1
SUCTION TUBE FRAZIER 10FR DISP (MISCELLANEOUS) ×1 IMPLANT
SUT ETHILON 2 0 FS 18 (SUTURE) IMPLANT
SUT MNCRL AB 3-0 PS2 27 (SUTURE) IMPLANT
SUT VIC AB 0 CT1 27 (SUTURE) ×2
SUT VIC AB 0 CT1 27XBRD ANBCTR (SUTURE) ×2 IMPLANT
SUT VIC AB 1 CTX 27 (SUTURE) ×3 IMPLANT
SUT VIC AB 2-0 CT1 27 (SUTURE) ×4
SUT VIC AB 2-0 CT1 TAPERPNT 27 (SUTURE) ×4 IMPLANT
SYR 50ML LL SCALE MARK (SYRINGE) ×2 IMPLANT
TIBIA STEM 5 DEG SZ D L KNEE (Knees) ×1 IMPLANT
TOWEL GREEN STERILE (TOWEL DISPOSABLE) ×1 IMPLANT
TOWEL GREEN STERILE FF (TOWEL DISPOSABLE) ×1 IMPLANT
TRAY CATH 16FR W/PLASTIC CATH (SET/KITS/TRAYS/PACK) IMPLANT
TUBE SUCT ARGYLE STRL (TUBING) ×1 IMPLANT
UNDERPAD 30X36 HEAVY ABSORB (UNDERPADS AND DIAPERS) ×1 IMPLANT
YANKAUER SUCT BULB TIP NO VENT (SUCTIONS) ×2 IMPLANT

## 2022-07-23 NOTE — Anesthesia Postprocedure Evaluation (Signed)
Anesthesia Post Note  Patient: Erin Williamson  Procedure(s) Performed: LEFT TOTAL KNEE ARTHROPLASTY (Left: Knee)     Patient location during evaluation: PACU Anesthesia Type: Spinal Level of consciousness: awake Pain management: pain level controlled Vital Signs Assessment: post-procedure vital signs reviewed and stable Respiratory status: spontaneous breathing Cardiovascular status: stable Postop Assessment: no headache, no backache, spinal receding, patient able to bend at knees and no apparent nausea or vomiting Anesthetic complications: no  No notable events documented.  Last Vitals:  Vitals:   07/23/22 1315 07/23/22 1330  BP: 107/63 102/64  Pulse: 70 69  Resp: 15 15  Temp:  36.5 C  SpO2: 90% 93%    Last Pain:  Vitals:   07/23/22 1330  TempSrc:   PainSc: 0-No pain                 Huston Foley

## 2022-07-23 NOTE — Anesthesia Procedure Notes (Signed)
Spinal  Patient location during procedure: OR Start time: 07/23/2022 10:02 AM End time: 07/23/2022 10:05 AM Reason for block: surgical anesthesia Staffing Performed: anesthesiologist  Anesthesiologist: Lyn Hollingshead, MD Performed by: Lyn Hollingshead, MD Authorized by: Lyn Hollingshead, MD   Preanesthetic Checklist Completed: patient identified, IV checked, site marked, risks and benefits discussed, surgical consent, monitors and equipment checked, pre-op evaluation and timeout performed Spinal Block Patient position: sitting Prep: DuraPrep and site prepped and draped Patient monitoring: continuous pulse ox and blood pressure Approach: midline Location: L3-4 Injection technique: single-shot Needle Needle type: Pencan  Needle gauge: 24 G Needle length: 10 cm Needle insertion depth: 5 cm Assessment Sensory level: T8 Events: CSF return

## 2022-07-23 NOTE — Discharge Instructions (Signed)

## 2022-07-23 NOTE — Evaluation (Signed)
Physical Therapy Evaluation Patient Details Name: Erin Williamson MRN: [id number] DOB: Jul 01, 1960 Today's Date: 07/23/2022  History of Present Illness  Pt is 62 year old presented to [facility] on  07/23/22 lt TKR. PMH - OA, HTN, anxiety  Clinical Impression  Pt with expected decr in mobility s/p lt TKR. Pt tolerated amb in hall on POD #0 and with good lt knee ROM. Expect pt will make excellent progress and plans to return home with daughter assisting as needed.      Recommendations for follow up therapy are one component of a multi-disciplinary discharge planning process, led by the attending physician.  Recommendations may be updated based on patient status, additional functional criteria and insurance authorization.  Follow Up Recommendations Follow physician's recommendations for discharge plan and follow up therapies      Assistance Recommended at Discharge PRN  Patient can return home with the following  Assist for transportation;Help with stairs or ramp for entrance    Equipment Recommendations None recommended by PT  Recommendations for Other Services       Functional Status Assessment Patient has had a recent decline in their functional status and demonstrates the ability to make significant improvements in function in a reasonable and predictable amount of time.     Precautions / Restrictions Precautions Precautions: Knee      Mobility  Bed Mobility Overal bed mobility: Needs Assistance Bed Mobility: Supine to Sit     Supine to sit: Supervision, HOB elevated     General bed mobility comments: Incr time. Supervision for safety and lines    Transfers Overall transfer level: Needs assistance Equipment used: Rolling walker (2 wheels) Transfers: Sit to/from Stand Sit to Stand: Min guard           General transfer comment: Assist for safety. Verbal cues for initial hand placement    Ambulation/Gait Ambulation/Gait assistance: Min guard Gait Distance (Feet):  225 Feet Assistive device: Rolling walker (2 wheels) Gait Pattern/deviations: Step-through pattern, Decreased stance time - left Gait velocity: decr Gait velocity interpretation: 1.31 - 2.62 ft/sec, indicative of limited community ambulator   General Gait Details: Assist for safety. Initial cues for technique  Stairs            Wheelchair Mobility    Modified Rankin (Stroke Patients Only)       Balance Overall balance assessment: No apparent balance deficits (not formally assessed)                                           Pertinent Vitals/Pain Pain Assessment Pain Assessment: Faces Faces Pain Scale: Hurts a little bit Pain Location: lt knee Pain Descriptors / Indicators: Sore Pain Intervention(s): Limited activity within patient's tolerance, Monitored during session    Home Living Family/patient expects to be discharged to:: Private residence Living Arrangements: Alone Available Help at Discharge: Family (daughter in town to stay with patient until not needed) Type of Home: House Home Access: Stairs to enter Entrance Stairs-Rails: Psychiatric nurse of Steps: 4   Home Layout: One level Home Equipment: Conservation officer, nature (2 wheels);Cane - single point      Prior Function Prior Level of Function : Independent/Modified Independent;Driving;Working/employed             Mobility Comments: Used cane due to knee pain. Pt has been sidestepping up/down stairs into home.       Hand Dominance  Extremity/Trunk Assessment   Upper Extremity Assessment Upper Extremity Assessment: Overall WFL for tasks assessed    Lower Extremity Assessment Lower Extremity Assessment: LLE deficits/detail LLE Deficits / Details: Fair quad set, AAROM 0-95       Communication   Communication: No difficulties  Cognition Arousal/Alertness: Awake/alert Behavior During Therapy: WFL for tasks assessed/performed Overall Cognitive Status: Within  Functional Limits for tasks assessed                                          General Comments      Exercises Total Joint Exercises Ankle Circles/Pumps: AROM, Both, 5 reps, Seated Quad Sets: AROM, Both, 5 reps, Seated Long Arc Quad: AROM, Left, 5 reps, Seated Knee Flexion: AAROM, Left, 5 reps, Seated Goniometric ROM: 0-95   Assessment/Plan    PT Assessment Patient needs continued PT services  PT Problem List Decreased strength;Decreased range of motion;Decreased mobility;Pain       PT Treatment Interventions DME instruction;Gait training;Stair training;Functional mobility training;Therapeutic activities;Therapeutic exercise;Patient/family education    PT Goals (Current goals can be found in the Care Plan section)  Acute Rehab PT Goals Patient Stated Goal: Return to her normal activities without having to use a cane PT Goal Formulation: With patient Time For Goal Achievement: 07/26/22 Potential to Achieve Goals: Good    Frequency 7X/week     Co-evaluation               AM-PAC PT "6 Clicks" Mobility  Outcome Measure Help needed turning from your back to your side while in a flat bed without using bedrails?: None Help needed moving from lying on your back to sitting on the side of a flat bed without using bedrails?: A Little Help needed moving to and from a bed to a chair (including a wheelchair)?: A Little Help needed standing up from a chair using your arms (e.g., wheelchair or bedside chair)?: A Little Help needed to walk in hospital room?: A Little Help needed climbing 3-5 steps with a railing? : A Little 6 Click Score: 19    End of Session   Activity Tolerance: Patient tolerated treatment well Patient left: in chair;with call bell/phone within reach Nurse Communication: Mobility status PT Visit Diagnosis: Other abnormalities of gait and mobility (R26.89);Pain Pain - Right/Left: Left Pain - part of body: Knee    Time: [id number] PT Time  Calculation (min) (ACUTE ONLY): 27 min   Charges:   PT Evaluation $PT Eval Low Complexity: 1 Low PT Treatments $Gait Training: 8-22 mins        [place] 07/23/2022, 4:02 PM

## 2022-07-23 NOTE — H&P (Signed)
PREOPERATIVE H&P  Chief Complaint: left knee osteoarthritis  HPI: Erin Williamson is a 62 y.o. female who presents for surgical treatment of left knee osteoarthritis.  She denies any changes in medical history.  Past Medical History:  Diagnosis Date   Anxiety    Arthritis    GERD (gastroesophageal reflux disease)    Hyperlipidemia    Hypertension    Migraine    Osteoarthritis    left and right knee   PONV (postoperative nausea and vomiting)    Past Surgical History:  Procedure Laterality Date   CESAREAN SECTION     1987 and 1998   tummy tuck  2000   Social History   Socioeconomic History   Marital status: Divorced    Spouse name: Not on file   Number of children: 2   Years of education: Not on file   Highest education level: Not on file  Occupational History   Not on file  Tobacco Use   Smoking status: Former    Types: Cigarettes    Quit date: 05/27/1988    Years since quitting: 34.1   Smokeless tobacco: Never  Vaping Use   Vaping Use: Never used  Substance and Sexual Activity   Alcohol use: Yes    Comment: maybe 1 drink once a month   Drug use: Never   Sexual activity: Not Currently    Birth control/protection: None  Other Topics Concern   Not on file  Social History Narrative   Not on file   Social Determinants of Health   Financial Resource Strain: Not on file  Food Insecurity: Not on file  Transportation Needs: Not on file  Physical Activity: Not on file  Stress: Not on file  Social Connections: Not on file   Family History  Problem Relation Age of Onset   COPD Mother    Heart attack Mother    Heart disease Mother    Hyperlipidemia Mother    Hypertension Mother    Early death Father    Stroke Sister    Arthritis Sister    Colon cancer Sister    Mental illness Son    Rectal cancer Neg Hx    Stomach cancer Neg Hx    Esophageal cancer Neg Hx    Liver cancer Neg Hx    Pancreatic cancer Neg Hx    Allergies  Allergen Reactions    Ofloxacin Hives, Itching and Rash   Prior to Admission medications   Medication Sig Start Date End Date Taking? Authorizing Provider  acetaminophen (TYLENOL) 650 MG CR tablet Take 1,300 mg by mouth every 8 (eight) hours as needed for pain.   Yes [provider]  amLODipine (NORVASC) 5 MG tablet TAKE 1 TABLET (5 MG TOTAL) BY MOUTH DAILY. 05/03/22  Yes Midge Minium, MD  aspirin EC 81 MG tablet Take 1 tablet (81 mg total) by mouth 2 (two) times daily. To be taken after surgery to prevent blood clots 07/15/22 07/15/23 Yes Aundra Dubin, PA-C  Cholecalciferol (VITAMIN D-3 PO) Take 2,000 Units by mouth daily.   Yes [provider]  docusate sodium (COLACE) 100 MG capsule Take 1 capsule (100 mg total) by mouth daily as needed. 07/15/22 07/15/23 Yes Aundra Dubin, PA-C  LORazepam (ATIVAN) 1 MG tablet Take 0.5 tablets (0.5 mg total) by mouth 3 (three) times daily as needed for anxiety (panic attacks). 05/29/22  Yes Midge Minium, MD  MAGNESIUM PO Take 1 tablet by mouth daily.  Yes [provider]  methocarbamol (ROBAXIN-750) 750 MG tablet Take 1 tablet (750 mg total) by mouth 2 (two) times daily as needed for muscle spasms. 07/15/22   Aundra Dubin, PA-C  Multiple Vitamin (MULTIVITAMIN PO) Take 1 tablet by mouth daily.   Yes [provider]  ondansetron (ZOFRAN) 4 MG tablet Take 1 tablet (4 mg total) by mouth every 8 (eight) hours as needed for nausea or vomiting. 07/15/22   Aundra Dubin, PA-C  oxyCODONE-acetaminophen (PERCOCET) 5-325 MG tablet Take 1-2 tablets by mouth every 6 (six) hours as needed. To be taken after surgery 07/15/22   Aundra Dubin, PA-C  traZODone (DESYREL) 50 MG tablet Take 0.5-1 tablets (25-50 mg total) by mouth at bedtime as needed for sleep. 07/01/22  Yes Midge Minium, MD  FLUoxetine (PROZAC) 20 MG capsule TAKE 3 CAPSULES BY MOUTH EVERY DAY Patient not taking: Reported on 07/11/2022 04/05/22   Midge Minium, MD   meloxicam (MOBIC) 15 MG tablet Take 0.5-1 tablets (7.5-15 mg total) by mouth daily as needed for pain. Patient not taking: Reported on 07/11/2022 07/07/20   Hilts, Legrand Como, MD  Semaglutide-Weight Management (WEGOVY) 0.25 MG/0.5ML SOAJ INJECT 0.'25MG'$  INTO THE SKIN ONE TIME PER WEEK 01/31/22   Midge Minium, MD     Positive ROS: All other systems have been reviewed and were otherwise negative with the exception of those mentioned in the HPI and as above.  Physical Exam: General: Alert, no acute distress Cardiovascular: No pedal edema Respiratory: No cyanosis, no use of accessory musculature GI: abdomen soft Skin: No lesions in the area of chief complaint Neurologic: Sensation intact distally Psychiatric: Patient is competent for consent with normal mood and affect Lymphatic: no lymphedema  MUSCULOSKELETAL: exam stable  Assessment: left knee osteoarthritis  Plan: Plan for Procedure(s): LEFT TOTAL KNEE ARTHROPLASTY  The risks benefits and alternatives were discussed with the patient including but not limited to the risks of nonoperative treatment, versus surgical intervention including infection, bleeding, nerve injury,  blood clots, cardiopulmonary complications, morbidity, mortality, among others, and they were willing to proceed.   Eduard Roux, MD 07/23/2022 9:04 AM

## 2022-07-23 NOTE — Anesthesia Preprocedure Evaluation (Addendum)
Anesthesia Evaluation  Patient identified by MRN, date of birth, ID band Patient awake    Reviewed: Allergy & Precautions, NPO status , Patient's Chart, lab work & pertinent test results  History of Anesthesia Complications (+) PONV and history of anesthetic complications  Airway Mallampati: I       Dental no notable dental hx.    Pulmonary former smoker   Pulmonary exam normal        Cardiovascular hypertension, Pt. on medications Normal cardiovascular exam     Neuro/Psych  Headaches PSYCHIATRIC DISORDERS Anxiety        GI/Hepatic   Endo/Other  negative endocrine ROS    Renal/GU negative Renal ROS  negative genitourinary   Musculoskeletal  (+) Arthritis , Osteoarthritis,    Abdominal  (+) + obese  Peds  Hematology negative hematology ROS (+)   Anesthesia Other Findings   Reproductive/Obstetrics                             Anesthesia Physical Anesthesia Plan  ASA: 2  Anesthesia Plan: Spinal   Post-op Pain Management: Regional block*   Induction:   PONV Risk Score and Plan: 3 and Ondansetron, Dexamethasone, Midazolam and Propofol infusion  Airway Management Planned: Natural Airway and Simple Face Mask  Additional Equipment: None  Intra-op Plan:   Post-operative Plan:   Informed Consent: I have reviewed the patients History and Physical, chart, labs and discussed the procedure including the risks, benefits and alternatives for the proposed anesthesia with the patient or authorized representative who has indicated his/her understanding and acceptance.       Plan Discussed with: CRNA  Anesthesia Plan Comments:        Anesthesia Quick Evaluation

## 2022-07-23 NOTE — Transfer of Care (Signed)
Immediate Anesthesia Transfer of Care Note  Patient: Erin Williamson  Procedure(s) Performed: LEFT TOTAL KNEE ARTHROPLASTY (Left: Knee)  Patient Location: PACU  Anesthesia Type:MAC combined with regional for post-op pain  Level of Consciousness: awake, alert , and oriented  Airway & Oxygen Therapy: Patient Spontanous Breathing and Patient connected to nasal cannula oxygen  Post-op Assessment: Report given to RN and Post -op Vital signs reviewed and stable  Post vital signs: Reviewed and stable  Last Vitals:  Vitals Value Taken Time  BP 94/77 07/23/22 1215  Temp 98   Pulse 70 07/23/22 1217  Resp 17 07/23/22 1217  SpO2 95 % 07/23/22 1217  Vitals shown include unvalidated device data.  Last Pain:  Vitals:   07/23/22 0810  TempSrc: Oral  PainSc: 0-No pain         Complications: No notable events documented.

## 2022-07-23 NOTE — Op Note (Signed)
Total Knee Arthroplasty Procedure Note  Preoperative diagnosis: Left knee osteoarthritis  Postoperative diagnosis:same  Operative findings: Severe tricompartmental arthritis  Operative procedure: Left total knee arthroplasty. CPT [phone]  Surgeon: N. Eduard Roux, MD  Assist: Madalyn Rob, PA-C; necessary for the timely completion of procedure and due to complexity of procedure.  Anesthesia: Spinal, regional, local  Tourniquet time: see anesthesia record  Implants used: Zimmer persona nickel free Femur: CR 7 Tibia: D Patella: 32 mm Polyethylene: 10 mm, MC  Indication: Erin Williamson is a 62 y.o. year old female with a history of knee pain. Having failed conservative management, the patient elected to proceed with a total knee arthroplasty.  We have reviewed the risk and benefits of the surgery and they elected to proceed after voicing understanding.  Procedure:  After informed consent was obtained and understanding of the risk were voiced including but not limited to bleeding, infection, damage to surrounding structures including nerves and vessels, blood clots, leg length inequality and the failure to achieve desired results, the operative extremity was marked with verbal confirmation of the patient in the holding area.   The patient was then brought to the operating room and transported to the operating room table in the supine position.  A tourniquet was applied to the operative extremity around the upper thigh. The operative limb was then prepped and draped in the usual sterile fashion and preoperative antibiotics were administered.  A time out was performed prior to the start of surgery confirming the correct extremity, preoperative antibiotic administration, as well as team members, implants and instruments available for the case. Correct surgical site was also confirmed with preoperative radiographs. The limb was then elevated for exsanguination and the tourniquet was  inflated. A midline incision was made and a standard medial parapatellar approach was performed.  The infrapatellar fat pad was removed.  Suprapatellar synovium was removed to reveal the anterior distal femoral cortex.  A medial peel was performed to release the capsule of the medial tibial plateau.  The patella was then everted and was prepared and sized to a 32 mm.  A cover was placed on the patella for protection from retractors.  The knee was then brought into full flexion and we then turned our attention to the femur.  The cruciates were sacrificed.  Start site was drilled in the femur and the intramedullary distal femoral cutting guide was placed, set at 5 degrees valgus, taking 10 mm of distal resection. The distal cut was made. Osteophytes were then removed.  Next, the proximal tibial cutting guide was placed with appropriate slope, varus/valgus alignment and depth of resection. The proximal tibial cut was made taking 4 mm off the low side. Gap blocks were then used to assess the extension gap and alignment, and appropriate soft tissue releases were performed. Attention was turned back to the femur, which was sized using the sizing guide to a size 7. Appropriate rotation of the femoral component was determined using epicondylar axis, Whiteside's line, and assessing the flexion gap under ligament tension. The appropriate size 4-in-1 cutting block was placed and checked with an angel wing and cuts were made. Posterior femoral osteophytes and uncapped bone were then removed with the curved osteotome.  Trial components were placed, and stability was checked in full extension, mid-flexion, and deep flexion. Proper tibial rotation was determined and marked.  The patella tracked well without a lateral release.  The femoral lugs were then drilled. Trial components were then removed and tibial preparation performed.  The tibia was sized for a size D component.   The bony surfaces were irrigated with a pulse lavage  and then dried. Bone cement was vacuum mixed on the back table, and the final components sized above were cemented into place.  Antibiotic irrigation was placed in the knee joint and soft tissues while the cement cured.  After cement had finished curing, excess cement was removed. The stability of the construct was re-evaluated throughout a range of motion and found to be acceptable. The trial liner was removed, the knee was copiously irrigated, and the knee was re-evaluated for any excess bone debris. The real polyethylene liner, 10 mm thick, was inserted and checked to ensure the locking mechanism had engaged appropriately. The tourniquet was deflated and hemostasis was achieved. The wound was irrigated with normal saline.  One gram of vancomycin powder was placed in the surgical bed.  Topical 0.25% bupivacaine and meloxicam was placed in the joint for postoperative pain.  Capsular closure was performed with a #1 vicryl, subcutaneous fat closed with a 0 vicryl suture, then subcutaneous tissue closed with interrupted 2.0 vicryl suture. The skin was then closed with a 2.0 nylon and dermabond. A sterile dressing was applied.  The patient was awakened in the operating room and taken to recovery in stable condition. All sponge, needle, and instrument counts were correct at the end of the case.  [name] was necessary for opening, closing, retracting, limb positioning and overall facilitation and completion of the surgery.  Position: supine  Complications: none.  Time Out: performed   Drains/Packing: none  Estimated blood loss: minimal  Returned to Recovery Room: in good condition.   Antibiotics: yes   Mechanical VTE (DVT) Prophylaxis: sequential compression devices, TED thigh-high  Chemical VTE (DVT) Prophylaxis: aspirin  Fluid Replacement  Crystalloid: see anesthesia record Blood: none  FFP: none   Specimens Removed: 1 to pathology   Sponge and Instrument Count Correct? yes   PACU:  portable radiograph - knee AP and Lateral   Plan/RTC: Return in 2 weeks for wound check.   Weight Bearing/Load Lower Extremity: full   Implant Name Type Inv. Item Serial No. Manufacturer Lot No. LRB No. Used Action  STEM POLY PAT PLY 57M KNEE - [id number] Knees STEM POLY PAT PLY 57M KNEE  ZIMMER RECON(ORTH,TRAU,BIO,SG) [id number] Left 1 Implanted  TIBIA STEM 5 DEG SZ D L KNEE - [id number] Knees TIBIA STEM 5 DEG SZ D L KNEE  ZIMMER RECON(ORTH,TRAU,BIO,SG) [id number] Left 1 Implanted  COMP FEM CMT CR STD 7 LT - [id number] Knees COMP FEM CMT CR STD 7 LT  ZIMMER RECON(ORTH,TRAU,BIO,SG) [id number] Left 1 Implanted  CEMENT BONE REFOBACIN [id number] [place] - [id number] Cement CEMENT BONE REFOBACIN R1X40 [place]  ZIMMER RECON(ORTH,TRAU,BIO,SG) [id number] Left 2 Implanted  INSERT ARTISURF SZ 6-7 LT - [id number] Insert INSERT ARTISURF SZ 6-7 LT  ZIMMER RECON(ORTH,TRAU,BIO,SG) [id number] Left 1 Implanted    N. Eduard Roux, MD [facility] 11:41 AM

## 2022-07-23 NOTE — Anesthesia Procedure Notes (Signed)
Anesthesia Regional Block: Adductor canal block   Pre-Anesthetic Checklist: , timeout performed,  Correct Patient, Correct Site, Correct Laterality,  Correct Procedure, Correct Position, site marked,  Risks and benefits discussed,  Surgical consent,  Pre-op evaluation,  At surgeon's request and post-op pain management  Laterality: Lower and Left  Prep: chloraprep       Needles:  Injection technique: Single-shot  Needle Type: Echogenic Needle     Needle Length: 9cm  Needle Gauge: 20   Needle insertion depth: 4 cm   Additional Needles:   Procedures:,,,, ultrasound used (permanent image in chart),,    Narrative:  Start time: 07/23/2022 9:30 AM End time: 07/23/2022 9:38 AM Injection made incrementally with aspirations every 5 mL. Anesthesiologist: Lyn Hollingshead, MD

## 2022-07-23 NOTE — Evaluation (Signed)
Occupational Therapy Evaluation and Discharge Patient Details Name: Erin Williamson MRN: [id number] DOB: [birth year] Today's Date: 07/23/2022   History of Present Illness Pt is 62 year old presented to [facility] on  07/23/22 lt TKR. PMH - OA, HTN, anxiety   Clinical Impression   This 62 yo female admitted and underwent above presents to acute OT with all education completed. Pt's daughter will be at home with her as long as she is needed and can assist pt if needed; no further OT needs, we will sign off.     Recommendations for follow up therapy are one component of a multi-disciplinary discharge planning process, led by the attending physician.  Recommendations may be updated based on patient status, additional functional criteria and insurance authorization.   Follow Up Recommendations  No OT follow up     Assistance Recommended at Discharge PRN  Patient can return home with the following A little help with bathing/dressing/bathroom;Assistance with cooking/housework;Assist for transportation;Help with stairs or ramp for entrance    Functional Status Assessment  Patient has had a recent decline in their functional status and demonstrates the ability to make significant improvements in function in a reasonable and predictable amount of time. (without further need for skilled OT, all education completed)  Equipment Recommendations  None recommended by OT       Precautions / Restrictions Precautions Precautions: Knee Precaution Booklet Issued: No Restrictions Weight Bearing Restrictions: No             ADL either performed or assessed with clinical judgement   ADL                                         General ADL Comments: Educated pt on stepping into and out of shower stall (in with the good, out with the bad) to shower seat, sit<>stand safety with with RW to toilet, lower body dressing sequencing     Vision Patient Visual Report: No change from baseline               Pertinent Vitals/Pain Pain Assessment Pain Assessment: Faces Faces Pain Scale: Hurts a little bit Pain Location: lt knee Pain Descriptors / Indicators: Sore Pain Intervention(s): Limited activity within patient's tolerance, Monitored during session     Hand Dominance Right   Extremity/Trunk Assessment Upper Extremity Assessment Upper Extremity Assessment: Overall WFL for tasks assessed       Communication Communication Communication: No difficulties   Cognition Arousal/Alertness: Awake/alert Behavior During Therapy: WFL for tasks assessed/performed Overall Cognitive Status: Within Functional Limits for tasks assessed                                                  Home Living Family/patient expects to be discharged to:: Private residence Living Arrangements: Alone Available Help at Discharge: Family;Available 24 hours/day (dtr to stay with her for as long as she is needed) Type of Home: House Home Access: Stairs to enter [employer] of Steps: 4 Entrance Stairs-Rails: Right;Left Home Layout: One level     Bathroom Shower/Tub: Walk-in shower;Door   [employer] Toilet: Standard     Home Equipment: Conservation officer, nature (2 wheels);Cane - single point;Shower seat          Prior Functioning/Environment Prior Level of Function :  Independent/Modified Independent;Driving;Working/employed             Mobility Comments: Used cane due to knee pain. Pt has been sidestepping up/down stairs into home.          OT Problem List: Decreased range of motion;Decreased strength;Impaired balance (sitting and/or standing);Pain         OT Goals(Current goals can be found in the care plan section) Acute Rehab OT Goals Patient Stated Goal: to go home tomorrow, let this knee heel and then do the other one         AM-PAC OT "6 Clicks" Daily Activity     Outcome Measure Help from another person eating meals?: None Help from another person  taking care of personal grooming?: None Help from another person toileting, which includes using toliet, bedpan, or urinal?: A Little Help from another person bathing (including washing, rinsing, drying)?: A Little Help from another person to put on and taking off regular upper body clothing?: None Help from another person to put on and taking off regular lower body clothing?: A Little 6 Click Score: 21   End of Session Nurse Communication:  (no further OT needs)  Activity Tolerance: Patient tolerated treatment well Patient left: in chair  OT Visit Diagnosis: Unsteadiness on feet (R26.81);Other abnormalities of gait and mobility (R26.89);Pain Pain - Right/Left: Left Pain - part of body: Knee                Time: [id number] OT Time Calculation (min): 15 min Charges:  OT General Charges $OT Visit: 1 Visit OT Evaluation $OT Eval Low Complexity: 1 [place] Office [phone]    Almon Register 07/23/2022, 4:59 PM

## 2022-07-24 ENCOUNTER — Encounter (HOSPITAL_COMMUNITY): Payer: Self-pay | Admitting: Orthopaedic Surgery

## 2022-07-24 DIAGNOSIS — M1712 Unilateral primary osteoarthritis, left knee: Secondary | ICD-10-CM | POA: Diagnosis not present

## 2022-07-24 LAB — CBC
HCT: 34.2 % — ABNORMAL LOW (ref 36.0–46.0)
Hemoglobin: 11.1 g/dL — ABNORMAL LOW (ref 12.0–15.0)
MCH: 26.9 pg (ref 26.0–34.0)
MCHC: 32.5 g/dL (ref 30.0–36.0)
MCV: 83 fL (ref 80.0–100.0)
Platelets: 238 10*3/uL (ref 150–400)
RBC: 4.12 MIL/uL (ref 3.87–5.11)
RDW: 13.8 % (ref 11.5–15.5)
WBC: 13.4 10*3/uL — ABNORMAL HIGH (ref 4.0–10.5)
nRBC: 0 % (ref 0.0–0.2)

## 2022-07-24 NOTE — Progress Notes (Signed)
Physical Therapy Treatment Patient Details Name: Erin Williamson MRN: [id number] DOB: August 15, 1960 Today's Date: 07/24/2022   History of Present Illness Pt is 62 year old presented to [facility] on  07/23/22 lt TKR. PMH - OA, HTN, anxiety    PT Comments    Pt greeted semi-reclined in bed and eager for OOB mobility. Pt grossly supervision for bed mobility, functional transfers, and gait with RW support. Pt with increased tolerance for gait this date with continued good ROM. Pt able to ascend 4 steps in stairwell x2 trials, initial trial forwards and second trial sideways as pt endorsing increased stability with two hands on one rail. Continued education re; activity recommendations and importance of continued mobility with pt verbalizing understanding and without further questions. Pt without need for PM session. Anticipate safe discharge, with assist level outlined below, once medically cleared, will continue to follow acutely.    Recommendations for follow up therapy are one component of a multi-disciplinary discharge planning process, led by the attending physician.  Recommendations may be updated based on patient status, additional functional criteria and insurance authorization.  Follow Up Recommendations  Follow physician's recommendations for discharge plan and follow up therapies     Assistance Recommended at Discharge PRN  Patient can return home with the following Assist for transportation;Help with stairs or ramp for entrance   Equipment Recommendations  None recommended by PT    Recommendations for Other Services       Precautions / Restrictions Precautions Precautions: Knee Precaution Booklet Issued: No Restrictions Weight Bearing Restrictions: No     Mobility  Bed Mobility Overal bed mobility: Needs Assistance Bed Mobility: Supine to Sit, Sit to Supine     Supine to sit: Supervision, HOB elevated Sit to supine: Supervision, HOB elevated   General bed mobility comments:  Incr time. Supervision for safety    Transfers Overall transfer level: Needs assistance Equipment used: Rolling walker (2 wheels) Transfers: Sit to/from Stand Sit to Stand: Supervision           General transfer comment: supervision for safety, good hand placement    Ambulation/Gait Ambulation/Gait assistance: Supervision Gait Distance (Feet): 560 Feet Assistive device: Rolling walker (2 wheels) Gait Pattern/deviations: Step-through pattern, Decreased stance time - left Gait velocity: decr     General Gait Details: step-to progressing to step through gati pattern, light cues for RW proximity at start   Stairs Stairs: Yes Stairs assistance: Min guard, Min assist Stair Management: One rail Left, Step to pattern, Forwards, Sideways Number of Stairs: 8 General stair comments: up/down 4 steps in stairwell forwards initially, educating pt on ascent/descnet sideways with pt able to perform and endorsing feeling more stable sideways with 2 hands on one rail, cues for sequencing throughout   Wheelchair Mobility    Modified Rankin (Stroke Patients Only)       Balance Overall balance assessment: No apparent balance deficits (not formally assessed)                                          Cognition Arousal/Alertness: Awake/alert Behavior During Therapy: WFL for tasks assessed/performed Overall Cognitive Status: Within Functional Limits for tasks assessed                                          Exercises Total  Joint Exercises Ankle Circles/Pumps: AROM, Both, 5 reps, Seated Long Arc Quad: AROM, Left, 5 reps, Seated Knee Flexion: AAROM, Left, 5 reps, Seated    General Comments        Pertinent Vitals/Pain Pain Assessment Pain Assessment: Faces Faces Pain Scale: Hurts a little bit Pain Location: lt knee Pain Descriptors / Indicators: Sore Pain Intervention(s): Monitored during session, Limited activity within patient's tolerance     Home Living                          Prior Function            PT Goals (current goals can now be found in the care plan section) Acute Rehab PT Goals Patient Stated Goal: get back to riding horses and roller skating PT Goal Formulation: With patient Time For Goal Achievement: 07/26/22 Progress towards PT goals: Progressing toward goals    Frequency    7X/week      PT Plan      Co-evaluation              AM-PAC PT "6 Clicks" Mobility   Outcome Measure  Help needed turning from your back to your side while in a flat bed without using bedrails?: None Help needed moving from lying on your back to sitting on the side of a flat bed without using bedrails?: A Little Help needed moving to and from a bed to a chair (including a wheelchair)?: A Little Help needed standing up from a chair using your arms (e.g., wheelchair or bedside chair)?: A Little Help needed to walk in hospital room?: A Little Help needed climbing 3-5 steps with a railing? : A Little 6 Click Score: 19    End of Session Equipment Utilized During Treatment: Gait belt Activity Tolerance: Patient tolerated treatment well Patient left: with call bell/phone within reach;in bed Nurse Communication: Mobility status PT Visit Diagnosis: Other abnormalities of gait and mobility (R26.89);Pain Pain - Right/Left: Left Pain - part of body: Knee     Time: [id number] PT Time Calculation (min) (ACUTE ONLY): 30 min  Charges:  $Gait Training: 23-37 mins                     Babbie Dondlinger R. PTA Acute Rehabilitation Services Office: [place] 07/24/2022, 10:48 AM

## 2022-07-24 NOTE — Progress Notes (Signed)
Subjective: 1 Day Post-Op Procedure(s) (LRB): LEFT TOTAL KNEE ARTHROPLASTY (Left) Patient reports pain as mild.    Objective: Vital signs in last 24 hours: Temp:  [97.5 F (36.4 C)-98.5 F (36.9 C)] 98 F (36.7 C) (02/28 0731) Pulse Rate:  [64-90] 73 (02/28 0731) Resp:  [10-20] 18 (02/28 0731) BP: (92-159)/(54-80) 115/68 (02/28 0731) SpO2:  [90 %-100 %] 95 % (02/28 0731)  Intake/Output from previous day: 02/27 0701 - 02/28 0700 In: 2781.3 [P.O.:600; I.V.:2181.3] Out: [id number] [Urine:1700; Blood:20] Intake/Output this shift: No intake/output data recorded.  Recent Labs    07/24/22 0619  HGB 11.1*   Recent Labs    07/24/22 0619  WBC 13.4*  RBC 4.12  HCT 34.2*  PLT 238   No results for input(s): "NA", "K", "CL", "CO2", "BUN", "CREATININE", "GLUCOSE", "CALCIUM" in the last 72 hours. No results for input(s): "LABPT", "INR" in the last 72 hours.  Neurologically intact Neurovascular intact Sensation intact distally Intact pulses distally Dorsiflexion/Plantar flexion intact Incision: dressing C/D/I No cellulitis present Compartment soft   Assessment/Plan: 1 Day Post-Op Procedure(s) (LRB): LEFT TOTAL KNEE ARTHROPLASTY (Left) Advance diet Up with therapy D/C IV fluids WBAT LLE ABLA- mild and stable Her insurance does not cover hhpt, but she is already scheduled for oppt D/c home once cleared by PT      Aundra Dubin 07/24/2022, 7:53 AM

## 2022-07-24 NOTE — Plan of Care (Signed)
Pt doing well. Pt given D/C instructions with verbal understanding. Rx's were sent to the pharmacy by MD. Pt's incision is clean and dry with no sign of infection. Pt's IV was removed prior to D/C. Pt D/C'd home via wheelchair per MD order. Pt is stable @ D/C and has no other needs at this time. Louellen Haldeman, RN  

## 2022-07-24 NOTE — Discharge Summary (Signed)
Patient ID: Erin Williamson MRN: [id number] DOB/AGE: [birth year] 62 y.o.  Admit date: 07/23/2022 Discharge date: 07/24/2022  Admission Diagnoses:  Principal Problem:   Primary osteoarthritis of left knee Active Problems:   Status post total left knee replacement   Discharge Diagnoses:  Same  Past Medical History:  Diagnosis Date   Anxiety    Arthritis    GERD (gastroesophageal reflux disease)    Hyperlipidemia    Hypertension    Migraine    Osteoarthritis    left and right knee   PONV (postoperative nausea and vomiting)     Surgeries: Procedure(s): LEFT TOTAL KNEE ARTHROPLASTY on 07/23/2022   Consultants:   Discharged Condition: Improved  Hospital Course: Erin Williamson is an 62 y.o. female who was admitted 07/23/2022 for operative treatment ofPrimary osteoarthritis of left knee. Patient has severe unremitting pain that affects sleep, daily activities, and work/hobbies. After pre-op clearance the patient was taken to the operating room on 07/23/2022 and underwent  Procedure(s): LEFT TOTAL KNEE ARTHROPLASTY.    Patient was given perioperative antibiotics:  Anti-infectives (From admission, onward)    Start     Dose/Rate Route Frequency Ordered Stop   07/23/22 1600  ceFAZolin (ANCEF) IVPB 2g/100 mL premix        2 g 200 mL/hr over 30 Minutes Intravenous Every 6 hours 07/23/22 1337 07/23/22 2126   07/23/22 1041  vancomycin (VANCOCIN) powder  Status:  Discontinued          As needed 07/23/22 1042 07/23/22 1212   07/23/22 0800  ceFAZolin (ANCEF) IVPB 2g/100 mL premix        2 g 200 mL/hr over 30 Minutes Intravenous On call to O.R. 07/23/22 0747 07/23/22 1016        Patient was given sequential compression devices, early ambulation, and chemoprophylaxis to prevent DVT.  Patient benefited maximally from hospital stay and there were no complications.    Recent vital signs: Patient Vitals for the past 24 hrs:  BP Temp Temp src Pulse Resp SpO2  07/24/22 0731 115/68 98 F  (36.7 C) Oral 73 18 95 %  07/23/22 2349 (!) 133/56 98.5 F (36.9 C) Oral 90 20 96 %  07/23/22 1951 (!) 119/57 98.4 F (36.9 C) Oral 77 18 93 %  07/23/22 1546 116/63 (!) 97.5 F (36.4 C) Oral 77 18 94 %  07/23/22 1355 111/65 97.6 F (36.4 C) Oral 70 18 97 %  07/23/22 1330 102/64 97.7 F (36.5 C) -- 69 15 93 %  07/23/22 1315 107/63 -- -- 70 15 90 %  07/23/22 1300 101/64 -- -- 73 15 94 %  07/23/22 1245 (!) 105/59 -- -- 71 16 97 %  07/23/22 1230 (!) 92/54 -- -- 70 18 96 %  07/23/22 1215 94/77 97.6 F (36.4 C) -- 64 18 92 %  07/23/22 0940 116/66 -- -- 66 10 95 %  07/23/22 0935 126/72 -- -- 70 12 90 %  07/23/22 0930 (!) 147/80 -- -- 73 14 96 %  07/23/22 0810 (!) 159/79 98.2 F (36.8 C) Oral 85 18 100 %     Recent laboratory studies:  Recent Labs    07/24/22 0619  WBC 13.4*  HGB 11.1*  HCT 34.2*  PLT 238     Discharge Medications:   Allergies as of 07/24/2022       Reactions   Ofloxacin Hives, Itching, Rash        Medication List     STOP taking these medications    acetaminophen  650 MG CR tablet Commonly known as: TYLENOL   meloxicam 15 MG tablet Commonly known as: MOBIC       TAKE these medications    amLODipine 5 MG tablet Commonly known as: NORVASC TAKE 1 TABLET (5 MG TOTAL) BY MOUTH DAILY.   aspirin EC 81 MG tablet Take 1 tablet (81 mg total) by mouth 2 (two) times daily. To be taken after surgery to prevent blood clots   docusate sodium 100 MG capsule Commonly known as: Colace Take 1 capsule (100 mg total) by mouth daily as needed.   FLUoxetine 20 MG capsule Commonly known as: PROZAC TAKE 3 CAPSULES BY MOUTH EVERY DAY   LORazepam 1 MG tablet Commonly known as: Ativan Take 0.5 tablets (0.5 mg total) by mouth 3 (three) times daily as needed for anxiety (panic attacks).   MAGNESIUM PO Take 1 tablet by mouth daily.   methocarbamol 750 MG tablet Commonly known as: Robaxin-750 Take 1 tablet (750 mg total) by mouth 2 (two) times daily as  needed for muscle spasms.   MULTIVITAMIN PO Take 1 tablet by mouth daily.   ondansetron 4 MG tablet Commonly known as: Zofran Take 1 tablet (4 mg total) by mouth every 8 (eight) hours as needed for nausea or vomiting.   oxyCODONE-acetaminophen 5-325 MG tablet Commonly known as: Percocet Take 1-2 tablets by mouth every 6 (six) hours as needed. To be taken after surgery   traZODone 50 MG tablet Commonly known as: DESYREL Take 0.5-1 tablets (25-50 mg total) by mouth at bedtime as needed for sleep.   VITAMIN D-3 PO Take 2,000 Units by mouth daily.   Wegovy 0.25 MG/0.5ML Soaj Generic drug: Semaglutide-Weight Management INJECT 0.'25MG'$  INTO THE SKIN ONE TIME PER WEEK               Durable Medical Equipment  (From admission, onward)           Start     Ordered   07/23/22 1344  DME Walker rolling  Once       Question Answer Comment  Walker: With 5 Inch Wheels   Patient needs a walker to treat with the following condition Status post left partial knee replacement      07/23/22 1343   07/23/22 1344  DME 3 n 1  Once        07/23/22 1343   07/23/22 1344  DME Bedside commode  Once       Question:  Patient needs a bedside commode to treat with the following condition  Answer:  Status post left partial knee replacement   07/23/22 1343            Diagnostic Studies: DG Knee Left Port  Result Date: 07/23/2022 CLINICAL DATA:  Status post left knee replacement. EXAM: PORTABLE LEFT KNEE - 1-2 VIEW COMPARISON:  June 25, 2022. FINDINGS: The left femoral and tibial components are well situated. Expected postoperative changes are noted in the soft tissues anteriorly. IMPRESSION: Status post left total knee arthroplasty. Electronically Signed   By: Marijo Conception M.D.   On: 07/23/2022 12:59   XR KNEE 3 VIEW LEFT  Result Date: 06/25/2022 Advanced tricompartmental degenerative joint disease.  Bone-on-bone joint space narrowing varus deformity.   Disposition: Discharge  disposition: 01-Home or Self Care          Follow-up Information     Leandrew Koyanagi, MD. Schedule an appointment as soon as possible for a visit in 2 week(s).   Specialty: Orthopedic Surgery  Contact information: [address] [place] [address] [phone]                  Signed: Aundra Dubin 07/24/2022, 7:54 AM

## 2022-07-24 NOTE — Progress Notes (Signed)
Per PA note pt wants to wait and do outpatient therapy.  No needs per TOC.

## 2022-07-25 ENCOUNTER — Telehealth: Payer: Self-pay

## 2022-07-25 NOTE — Transitions of Care (Post Inpatient/ED Visit) (Signed)
   07/25/2022  Name: Erin Williamson MRN: [id number] DOB: 08-24-1960  Today's TOC FU Call Status: Today's TOC FU Call Status:: Successful TOC FU Call Competed TOC FU Call Complete Date: 07/25/22  Transition Care Management Follow-up Telephone Call Date of Discharge: 07/24/22 Discharge [facility]) Type of Discharge: Inpatient Admission Primary Inpatient Discharge Diagnosis:: Primary osteoarthritis of left knee How have you been since you were released from the hospital?: Better Any questions or concerns?: No  Items Reviewed: Did you receive and understand the discharge instructions provided?: Yes Medications obtained and verified?: Yes (Medications Reviewed) Any new allergies since your discharge?: No Dietary orders reviewed?: Yes Do you have support at home?: Yes  Home Care and Equipment/Supplies: [place] Ordered?: No Any new equipment or medical supplies ordered?: Yes Name of Medical supply agency?: hospital Were you able to get the equipment/medical supplies?: Yes (Rolling walker -[employer] (pt doesnt want BSC)) Do you have any questions related to the use of the equipment/supplies?: No  Functional Questionnaire: Do you need assistance with bathing/showering or dressing?: Yes Do you need assistance with meal preparation?: No Do you need assistance with eating?: No Do you have difficulty maintaining continence: No Do you need assistance with getting out of bed/getting out of a chair/moving?: Yes Do you have difficulty managing or taking your medications?: No  Folllow up appointments reviewed: PCP Follow-up appointment confirmed?: No (specialist) MD Provider Line [phone] Given: Yes [facility] Follow-up appointment confirmed?: Yes Date of Specialist follow-up appointment?: 08/07/22 Follow-Up Specialty Provider:: Dr Erlinda Hong Do you need transportation to your follow-up appointment?: No Do you understand care options if your  condition(s) worsen?: Yes-patient verbalized understanding    Morrison LPN [place] Direct Dial [phone]

## 2022-07-30 ENCOUNTER — Telehealth: Payer: Self-pay | Admitting: Orthopaedic Surgery

## 2022-07-30 NOTE — Telephone Encounter (Signed)
Pt called requesting a refill of oxycodone. Please send to pharmacy on file.Pt phone number is [phone].

## 2022-07-31 ENCOUNTER — Other Ambulatory Visit: Payer: Self-pay | Admitting: Physician Assistant

## 2022-07-31 ENCOUNTER — Telehealth: Payer: Self-pay | Admitting: Orthopaedic Surgery

## 2022-07-31 MED ORDER — OXYCODONE-ACETAMINOPHEN 5-325 MG PO TABS: 1.0000 | ORAL_TABLET | Freq: Four times a day (QID) | ORAL | 0 refills | Status: DC | PRN

## 2022-07-31 NOTE — Telephone Encounter (Signed)
Sent in percocet.  Did she need something else as well?

## 2022-07-31 NOTE — Telephone Encounter (Signed)
Patient states she is still waiting on her refills. Please advise.Marland KitchenMarland Kitchen

## 2022-07-31 NOTE — Telephone Encounter (Signed)
Called and notified patient.

## 2022-08-03 ENCOUNTER — Other Ambulatory Visit: Payer: Self-pay | Admitting: Family Medicine

## 2022-08-07 ENCOUNTER — Ambulatory Visit (INDEPENDENT_AMBULATORY_CARE_PROVIDER_SITE_OTHER): Payer: Managed Care, Other (non HMO) | Admitting: Orthopaedic Surgery

## 2022-08-07 DIAGNOSIS — Z96652 Presence of left artificial knee joint: Secondary | ICD-10-CM

## 2022-08-07 MED ORDER — OXYCODONE-ACETAMINOPHEN 5-325 MG PO TABS: 1.0000 | ORAL_TABLET | Freq: Two times a day (BID) | ORAL | 0 refills | Status: DC | PRN

## 2022-08-07 NOTE — Progress Notes (Signed)
Post-Op Visit Note   Patient: Erin Williamson           Date of Birth: Dec 01, 1960           MRN: [id number] Visit Date: 08/07/2022 PCP: Midge Minium, MD   Assessment & Plan:  Chief Complaint:  Chief Complaint  Patient presents with   Left Knee - Routine Post Op   Visit Diagnoses:  1. Status post total left knee replacement     Plan: Patient is 2 weeks status post left total knee replacement.  Overall she is doing well.  No real complaints today.  Examination left knee shows healed surgical incision.  No drainage or signs of infection.  Expected postoperative swelling and limitation range of motion.  Range of motion is progressing nicely.  Good varus valgus stability.  Patient is doing well for 2 weeks.  Percocet refilled.  She will continue outpatient PT.  Prefer that she stay out of work for another 2 weeks at least so that she can maximize the CPM and physical therapy.  Sutures removed Steri-Strips applied.  Instructions reviewed.  Dental prophylaxis reinforced.  Questions encouraged and answered.  Recheck in 4 weeks with two-view x-rays of the left knee.  Follow-Up Instructions: Return in about 4 weeks (around 09/04/2022) for schedule follow up with Mendel Ryder.   Orders:  No orders of the defined types were placed in this encounter.  Meds ordered this encounter  Medications   oxyCODONE-acetaminophen (PERCOCET) 5-325 MG tablet    Sig: Take 1 tablet by mouth 2 (two) times daily as needed. To be taken after surgery    Dispense:  40 tablet    Refill:  0    Imaging: No results found.  PMFS History: Patient Active Problem List   Diagnosis Date Noted   Status post total left knee replacement 07/23/2022   Primary osteoarthritis of left knee 07/22/2022   Vitreous floaters of right eye 10/28/2021   HTN (hypertension) 05/22/2021   Endometrial thickening on ultrasound 04/26/2021   Physical exam 06/23/2019   Vitamin D deficiency 06/23/2019   Family history of colon  cancer 12/21/2018   Generalized anxiety disorder 12/21/2018   Hyperlipidemia 12/21/2018   Obesity (BMI 30-39.9) 12/21/2018   Past Medical History:  Diagnosis Date   Anxiety    Arthritis    GERD (gastroesophageal reflux disease)    Hyperlipidemia    Hypertension    Migraine    Osteoarthritis    left and right knee   PONV (postoperative nausea and vomiting)     Family History  Problem Relation Age of Onset   COPD Mother    Heart attack Mother    Heart disease Mother    Hyperlipidemia Mother    Hypertension Mother    Early death Father    Stroke Sister    Arthritis Sister    Colon cancer Sister    Mental illness Son    Rectal cancer Neg Hx    Stomach cancer Neg Hx    Esophageal cancer Neg Hx    Liver cancer Neg Hx    Pancreatic cancer Neg Hx     Past Surgical History:  Procedure Laterality Date   CESAREAN SECTION     1987 and 1998   TOTAL KNEE ARTHROPLASTY Left 07/23/2022   Procedure: LEFT TOTAL KNEE ARTHROPLASTY;  Surgeon: Leandrew Koyanagi, MD;  Location: [facility];  Service: Orthopedics;  Laterality: Left;   tummy tuck  2000   Social History   Occupational History  Not on file  Tobacco Use   Smoking status: Former    Types: Cigarettes    Quit date: 05/27/1988    Years since quitting: 34.2   Smokeless tobacco: Never  Vaping Use   Vaping Use: Never used  Substance and Sexual Activity   Alcohol use: Yes    Comment: maybe 1 drink once a month   Drug use: Never   Sexual activity: Not Currently    Birth control/protection: None

## 2022-08-08 ENCOUNTER — Telehealth: Payer: Self-pay | Admitting: Orthopaedic Surgery

## 2022-08-08 NOTE — Telephone Encounter (Signed)
Yes it is fine

## 2022-08-08 NOTE — Telephone Encounter (Signed)
Patient called. She would like to return to work 08/29/22. Would like a letter for her job stating that. Her call back number is [phone]

## 2022-08-08 NOTE — Telephone Encounter (Signed)
Patient is requesting a letter to return to work April 3rd.  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  If there are any limitations, please include in the letter.  If any questions, please call [phone].   Attention:  Kirsten  Please put in my chart and patient can print out and email to [facility].

## 2022-08-23 ENCOUNTER — Encounter: Payer: Self-pay | Admitting: Orthopaedic Surgery

## 2022-08-26 ENCOUNTER — Other Ambulatory Visit: Payer: Self-pay | Admitting: Orthopaedic Surgery

## 2022-08-26 ENCOUNTER — Other Ambulatory Visit: Payer: Self-pay | Admitting: Physician Assistant

## 2022-08-26 MED ORDER — HYDROCODONE-ACETAMINOPHEN 5-325 MG PO TABS: 1.0000 | ORAL_TABLET | Freq: Four times a day (QID) | ORAL | 0 refills | Status: DC | PRN

## 2022-08-26 NOTE — Telephone Encounter (Signed)
Need to wean to norco and I sent in

## 2022-08-26 NOTE — Telephone Encounter (Signed)
Weaning to norco and I have sent in

## 2022-08-30 ENCOUNTER — Other Ambulatory Visit: Payer: Self-pay | Admitting: Physician Assistant

## 2022-09-04 ENCOUNTER — Ambulatory Visit (INDEPENDENT_AMBULATORY_CARE_PROVIDER_SITE_OTHER): Payer: Managed Care, Other (non HMO) | Admitting: Orthopaedic Surgery

## 2022-09-04 ENCOUNTER — Other Ambulatory Visit (INDEPENDENT_AMBULATORY_CARE_PROVIDER_SITE_OTHER): Payer: Managed Care, Other (non HMO)

## 2022-09-04 DIAGNOSIS — Z96652 Presence of left artificial knee joint: Secondary | ICD-10-CM

## 2022-09-04 NOTE — Progress Notes (Signed)
Post-Op Visit Note   Patient: Erin Williamson           Date of Birth: 07/04/1960           MRN: [id number] Visit Date: 09/04/2022 PCP: Sheliah Hatch, MD   Assessment & Plan:  Chief Complaint:  Chief Complaint  Patient presents with   Left Knee - Routine Post Op   Visit Diagnoses:  1. Status post total left knee replacement     Plan: Patient is now 6 weeks status post left total knee replacement.  She has pain at times on the lateral side.  Doing physical therapy twice a week and making excellent progress.  She does feel improvement overall.  Examination left knee shows fully healed surgical scar.  Range of motion is 4 to 120 degrees.  Good varus valgus stability.  I am happy with her progress and her recovery.  She will continue to do outpatient PT twice a week.  Dental prophylaxis reinforced.  Recheck in 6 weeks for 66-month visit.  Follow-Up Instructions: Return in about 6 weeks (around 10/16/2022) for follow up with lindsey for 3 month postop visit.   Orders:  Orders Placed This Encounter  Procedures   XR Knee 1-2 Views Left   No orders of the defined types were placed in this encounter.   Imaging: XR Knee 1-2 Views Left  Result Date: 09/04/2022 Stable left total knee replacement in good alignment.    PMFS History: Patient Active Problem List   Diagnosis Date Noted   Status post total left knee replacement 07/23/2022   Primary osteoarthritis of left knee 07/22/2022   Vitreous floaters of right eye 10/28/2021   HTN (hypertension) 05/22/2021   Endometrial thickening on ultrasound 04/26/2021   Physical exam 06/23/2019   Vitamin D deficiency 06/23/2019   Family history of colon cancer 12/21/2018   Generalized anxiety disorder 12/21/2018   Hyperlipidemia 12/21/2018   Obesity (BMI 30-39.9) 12/21/2018   Past Medical History:  Diagnosis Date   Anxiety    Arthritis    GERD (gastroesophageal reflux disease)    Hyperlipidemia    Hypertension    Migraine     Osteoarthritis    left and right knee   PONV (postoperative nausea and vomiting)     Family History  Problem Relation Age of Onset   COPD Mother    Heart attack Mother    Heart disease Mother    Hyperlipidemia Mother    Hypertension Mother    Early death Father    Stroke Sister    Arthritis Sister    Colon cancer Sister    Mental illness Son    Rectal cancer Neg Hx    Stomach cancer Neg Hx    Esophageal cancer Neg Hx    Liver cancer Neg Hx    Pancreatic cancer Neg Hx     Past Surgical History:  Procedure Laterality Date   CESAREAN SECTION     1987 and 1998   TOTAL KNEE ARTHROPLASTY Left 07/23/2022   Procedure: LEFT TOTAL KNEE ARTHROPLASTY;  Surgeon: Tarry Kos, MD;  Location: [facility] OR;  Service: Orthopedics;  Laterality: Left;   tummy tuck  2000   Social History   Occupational History   Not on file  Tobacco Use   Smoking status: Former    Types: Cigarettes    Quit date: 05/27/1988    Years since quitting: 34.2   Smokeless tobacco: Never  Vaping Use   Vaping Use: Never used  Substance  and Sexual Activity   Alcohol use: Yes    Comment: maybe 1 drink once a month   Drug use: Never   Sexual activity: Not Currently    Birth control/protection: None

## 2022-09-09 ENCOUNTER — Other Ambulatory Visit: Payer: Self-pay | Admitting: Family Medicine

## 2022-09-09 MED ORDER — LORAZEPAM 1 MG PO TABS: 0.5000 mg | ORAL_TABLET | Freq: Three times a day (TID) | ORAL | 0 refills | Status: DC | PRN

## 2022-09-09 NOTE — Telephone Encounter (Signed)
Pt aware Rx has been sent in.  

## 2022-09-09 NOTE — Telephone Encounter (Signed)
Ativan 1 mg LOV: 01/31/22 Last Refill:05/29/22 Upcoming appt: no apt

## 2022-09-13 ENCOUNTER — Encounter: Payer: Self-pay | Admitting: Orthopaedic Surgery

## 2022-09-23 ENCOUNTER — Telehealth: Payer: Self-pay

## 2022-09-23 ENCOUNTER — Other Ambulatory Visit: Payer: Self-pay

## 2022-09-23 DIAGNOSIS — M7989 Other specified soft tissue disorders: Secondary | ICD-10-CM

## 2022-09-23 NOTE — Telephone Encounter (Signed)
Called and spoke with patient. Ordered STAT ultrasound for DVT.

## 2022-09-23 NOTE — Telephone Encounter (Signed)
Pt is s/p a TKR 07/23/2022 she states that she has noticed increased swelling and tenderness to her calf. States that it feels hard and tight and that swelling goes from the knee to her ankle. She has completed all post op blood thinner. She does not note any warmth to the leg and does say it looks a little pink. Cb [phone] pt wants to know if this is normal.

## 2022-09-25 ENCOUNTER — Ambulatory Visit (HOSPITAL_COMMUNITY)
Admission: RE | Admit: 2022-09-25 | Discharge: 2022-09-25 | Disposition: A | Discharge status: Home / Self Care | Payer: Managed Care, Other (non HMO) | Source: Ambulatory Visit | Attending: Orthopaedic Surgery | Admitting: Orthopaedic Surgery

## 2022-09-25 DIAGNOSIS — M7989 Other specified soft tissue disorders: Secondary | ICD-10-CM | POA: Insufficient documentation

## 2022-09-25 DIAGNOSIS — M79662 Pain in left lower leg: Secondary | ICD-10-CM | POA: Diagnosis not present

## 2022-09-30 ENCOUNTER — Encounter: Payer: Self-pay | Admitting: Family Medicine

## 2022-10-02 ENCOUNTER — Other Ambulatory Visit: Payer: Self-pay | Admitting: Family Medicine

## 2022-10-02 DIAGNOSIS — F411 Generalized anxiety disorder: Secondary | ICD-10-CM

## 2022-10-15 ENCOUNTER — Ambulatory Visit (INDEPENDENT_AMBULATORY_CARE_PROVIDER_SITE_OTHER): Payer: Managed Care, Other (non HMO) | Admitting: Physician Assistant

## 2022-10-15 DIAGNOSIS — Z96652 Presence of left artificial knee joint: Secondary | ICD-10-CM

## 2022-10-15 MED ORDER — METHOCARBAMOL 750 MG PO TABS: 750.0000 mg | ORAL_TABLET | Freq: Two times a day (BID) | ORAL | 1 refills | Status: DC | PRN

## 2022-10-15 MED ORDER — PREDNISONE 10 MG (21) PO TBPK: ORAL_TABLET | ORAL | 0 refills | Status: DC

## 2022-10-15 NOTE — Progress Notes (Signed)
Post-Op Visit Note   Patient: Erin Williamson           Date of Birth: March 02, 1961           MRN: [id number] Visit Date: 10/15/2022 PCP: Sheliah Hatch, MD   Assessment & Plan:  Chief Complaint:  Chief Complaint  Patient presents with   Left Knee - Follow-up   Visit Diagnoses:  1. Status post total left knee replacement     Plan: Patient is a pleasant 62 year old female who comes in today 3 months status post left total knee replacement 07/23/2022.  She has been doing relatively well in regards to the knee.  She has been in physical therapy making good progress.  The main issue she is having is pain to the lateral left knee which radiates up the back of the leg into the buttock.  She does have a history of sciatica.  She currently denies any pain distal to the knee.  No paresthesias.  They have been working on IT band exercises and outpatient physical therapy and the patient has been working on these at home as well for the past few weeks.  Examination of the left knee reveals range of motion from 0 to 100 degrees.  She is stable to valgus and varus stress.  She does have slight tenderness along the lateral femoral condyle.  No tenderness to the greater trochanter.  She does have limited hip flexion due to tightness.  No pain with straight leg raise.  She is neurovascularly intact distally.  At this point, I believe she is recovering well from the knee replacement.  She is having pain likely from the IT band and possibly in addition to the lower back radiating down the back of the leg.  I have started her on a steroid pack and muscle relaxer.  Of provided a hard prescription for PT to incorporate exercises for this in addition to modalities.  Patient will follow-up with [place] in 3 months for repeat evaluation and x-rays of the left knee.  Dental prophylaxis reinforced.  Call with concerns or questions in the meantime.  Follow-Up Instructions: Return in about 3 months (around 01/15/2023).    Orders:  No orders of the defined types were placed in this encounter.  Meds ordered this encounter  Medications   methocarbamol (ROBAXIN-750) 750 MG tablet    Sig: Take 1 tablet (750 mg total) by mouth 2 (two) times daily as needed for muscle spasms.    Dispense:  20 tablet    Refill:  1   predniSONE (STERAPRED UNI-PAK 21 TAB) 10 MG (21) TBPK tablet    Sig: Take as directed    Dispense:  21 tablet    Refill:  0    Imaging: No new imaging  PMFS History: Patient Active Problem List   Diagnosis Date Noted   Status post total left knee replacement 07/23/2022   Primary osteoarthritis of left knee 07/22/2022   Vitreous floaters of right eye 10/28/2021   HTN (hypertension) 05/22/2021   Endometrial thickening on ultrasound 04/26/2021   Physical exam 06/23/2019   Vitamin D deficiency 06/23/2019   Family history of colon cancer 12/21/2018   Generalized anxiety disorder 12/21/2018   Hyperlipidemia 12/21/2018   Obesity (BMI 30-39.9) 12/21/2018   Past Medical History:  Diagnosis Date   Anxiety    Arthritis    GERD (gastroesophageal reflux disease)    Hyperlipidemia    Hypertension    Migraine    Osteoarthritis  left and right knee   PONV (postoperative nausea and vomiting)     Family History  Problem Relation Age of Onset   COPD Mother    Heart attack Mother    Heart disease Mother    Hyperlipidemia Mother    Hypertension Mother    Early death Father    Stroke Sister    Arthritis Sister    Colon cancer Sister    Mental illness Son    Rectal cancer Neg Hx    Stomach cancer Neg Hx    Esophageal cancer Neg Hx    Liver cancer Neg Hx    Pancreatic cancer Neg Hx     Past Surgical History:  Procedure Laterality Date   CESAREAN SECTION     1987 and 1998   TOTAL KNEE ARTHROPLASTY Left 07/23/2022   Procedure: LEFT TOTAL KNEE ARTHROPLASTY;  Surgeon: Tarry Kos, MD;  Location: [facility] OR;  Service: Orthopedics;  Laterality: Left;   tummy tuck  2000   Social History    Occupational History   Not on file  Tobacco Use   Smoking status: Former    Types: Cigarettes    Quit date: 05/27/1988    Years since quitting: 34.4   Smokeless tobacco: Never  Vaping Use   Vaping Use: Never used  Substance and Sexual Activity   Alcohol use: Yes    Comment: maybe 1 drink once a month   Drug use: Never   Sexual activity: Not Currently    Birth control/protection: None

## 2022-11-19 ENCOUNTER — Other Ambulatory Visit: Payer: Self-pay | Admitting: Family Medicine

## 2022-11-25 NOTE — Progress Notes (Unsigned)
Office Visit Note   Patient: Erin Williamson           Date of Birth: [birth year]           MRN: [id number] Visit Date: 11/26/2022              Requested by: Sheliah Hatch, MD [address] [place],  [place] [address] PCP: Sheliah Hatch, MD   Assessment & Plan: Visit Diagnoses:  1. Status post total left knee replacement     Plan: Patient is now 5 months status post left total knee replacement.  Reassurance was provided that the implants are all stable without any evidence of complications.  I think her symptoms stem from impingement of some scar tissue and possibly some irritation and inflammation of the IT band.  I recommend continue with activity as tolerated.  I would expect this to resolve with time.  Recheck in 6 months with two-view x-rays of the left knee.  Follow-Up Instructions: Return in about 6 months (around 05/29/2023).   Orders:  Orders Placed This Encounter  Procedures   XR KNEE 3 VIEW LEFT   Meds ordered this encounter  Medications   amoxicillin (AMOXIL) 500 MG capsule    Sig: Take 4 capsules (2,000 mg total) by mouth once for 1 dose.    Dispense:  4 capsule    Refill:  6      Procedures: No procedures performed   Clinical Data: No additional findings.   Subjective: Chief Complaint  Patient presents with   Left Knee - Follow-up    Left total knee arthroplasty 07/23/2022    HPI Erin Williamson is 5 months postop from left total knee replacement.  Comes in for evaluation of stinging lateral knee pain when she pivots or bends.  She is still doing home exercises.  She tried some muscle relaxers and oral steroids which did not help.  Denies any injuries.  Review of Systems  Constitutional: Negative.   HENT: Negative.    Eyes: Negative.   Respiratory: Negative.    Cardiovascular: Negative.   Endocrine: Negative.   Musculoskeletal: Negative.   Neurological: Negative.   Hematological: Negative.   Psychiatric/Behavioral: Negative.    All  other systems reviewed and are negative.    Objective: Vital Signs: There were no vitals taken for this visit.  Physical Exam Vitals and nursing note reviewed.  Constitutional:      Appearance: She is well-developed.  HENT:     Head: Normocephalic and atraumatic.  Pulmonary:     Effort: Pulmonary effort is normal.  Abdominal:     Palpations: Abdomen is soft.  Musculoskeletal:     Cervical back: Neck supple.  Skin:    General: Skin is warm.     Capillary Refill: Capillary refill takes less than 2 seconds.  Neurological:     Mental Status: She is alert and oriented to person, place, and time.  Psychiatric:        Behavior: Behavior normal.        Thought Content: Thought content normal.        Judgment: Judgment normal.     Ortho Exam Examination left knee shows fully healed surgical scar.  She has good range of motion without pain.  No joint effusion.  Slight tenderness along the lateral retinaculum.  I do feel crepitus along the lateral retinaculum with range of motion.  No signs of infection. Specialty Comments:  No specialty comments available.  Imaging: XR KNEE  3 VIEW LEFT  Result Date: 11/26/2022 Three-view x-rays demonstrate stable left total knee replacement without any subsidence or loosening.    PMFS History: Patient Active Problem List   Diagnosis Date Noted   Status post total left knee replacement 07/23/2022   Primary osteoarthritis of left knee 07/22/2022   Vitreous floaters of right eye 10/28/2021   HTN (hypertension) 05/22/2021   Endometrial thickening on ultrasound 04/26/2021   Physical exam 06/23/2019   Vitamin D deficiency 06/23/2019   Family history of colon cancer 12/21/2018   Generalized anxiety disorder 12/21/2018   Hyperlipidemia 12/21/2018   Obesity (BMI 30-39.9) 12/21/2018   Past Medical History:  Diagnosis Date   Anxiety    Arthritis    GERD (gastroesophageal reflux disease)    Hyperlipidemia    Hypertension    Migraine     Osteoarthritis    left and right knee   PONV (postoperative nausea and vomiting)     Family History  Problem Relation Age of Onset   COPD Mother    Heart attack Mother    Heart disease Mother    Hyperlipidemia Mother    Hypertension Mother    Early death Father    Stroke Sister    Arthritis Sister    Colon cancer Sister    Mental illness Son    Rectal cancer Neg Hx    Stomach cancer Neg Hx    Esophageal cancer Neg Hx    Liver cancer Neg Hx    Pancreatic cancer Neg Hx     Past Surgical History:  Procedure Laterality Date   CESAREAN SECTION     1987 and 1998   TOTAL KNEE ARTHROPLASTY Left 07/23/2022   Procedure: LEFT TOTAL KNEE ARTHROPLASTY;  Surgeon: Tarry Kos, MD;  Location: [facility] OR;  Service: Orthopedics;  Laterality: Left;   tummy tuck  2000   Social History   Occupational History   Not on file  Tobacco Use   Smoking status: Former    Types: Cigarettes    Quit date: 05/27/1988    Years since quitting: 34.5   Smokeless tobacco: Never  Vaping Use   Vaping Use: Never used  Substance and Sexual Activity   Alcohol use: Yes    Comment: maybe 1 drink once a month   Drug use: Never   Sexual activity: Not Currently    Birth control/protection: None

## 2022-11-26 ENCOUNTER — Ambulatory Visit: Payer: Managed Care, Other (non HMO) | Admitting: Orthopaedic Surgery

## 2022-11-26 ENCOUNTER — Encounter: Payer: Self-pay | Admitting: Orthopaedic Surgery

## 2022-11-26 ENCOUNTER — Other Ambulatory Visit (INDEPENDENT_AMBULATORY_CARE_PROVIDER_SITE_OTHER): Payer: Managed Care, Other (non HMO)

## 2022-11-26 DIAGNOSIS — Z96652 Presence of left artificial knee joint: Secondary | ICD-10-CM

## 2022-11-26 MED ORDER — AMOXICILLIN 500 MG PO CAPS: 2000.0000 mg | ORAL_CAPSULE | Freq: Once | ORAL | 6 refills | Status: AC

## 2022-12-03 ENCOUNTER — Ambulatory Visit: Payer: Managed Care, Other (non HMO) | Admitting: Orthopaedic Surgery

## 2022-12-23 ENCOUNTER — Encounter: Payer: Self-pay | Admitting: Orthopaedic Surgery

## 2022-12-23 NOTE — Telephone Encounter (Signed)
Yes 6 months

## 2022-12-31 ENCOUNTER — Other Ambulatory Visit: Payer: Self-pay | Admitting: Family Medicine

## 2022-12-31 ENCOUNTER — Other Ambulatory Visit: Payer: Self-pay

## 2022-12-31 MED ORDER — AMLODIPINE BESYLATE 5 MG PO TABS: 5.0000 mg | ORAL_TABLET | Freq: Every day | ORAL | 0 refills | Status: DC

## 2023-01-07 ENCOUNTER — Encounter: Payer: Self-pay | Admitting: Orthopaedic Surgery

## 2023-01-07 ENCOUNTER — Other Ambulatory Visit: Payer: Self-pay

## 2023-01-07 MED ORDER — AMOXICILLIN 500 MG PO TABS: ORAL_TABLET | ORAL | 2 refills | Status: DC

## 2023-02-04 ENCOUNTER — Other Ambulatory Visit: Payer: Self-pay | Admitting: Family Medicine

## 2023-04-09 ENCOUNTER — Other Ambulatory Visit: Payer: Self-pay | Admitting: Family Medicine

## 2023-04-09 DIAGNOSIS — F411 Generalized anxiety disorder: Secondary | ICD-10-CM

## 2023-07-25 ENCOUNTER — Encounter: Payer: Self-pay | Admitting: Family Medicine

## 2023-07-31 IMAGING — CT CT ABD-PELV W/ CM
2 of 5 series · 16 of 46 positions shown, 18 images · IV contrast (OMNIPAQUE 350)
Comparison: None.

CLINICAL DATA: Epigastric pain

EXAM:
CT ABDOMEN AND PELVIS WITH CONTRAST
TECHNIQUE: Multidetector CT imaging of the abdomen and pelvis was performed
using the standard protocol following bolus administration of
intravenous contrast.
CONTRAST:  80mL OMNIPAQUE IOHEXOL 350 MG/ML SOLN

[Series 2: axial st · axial · 0.81mm/px · z∈[+1086,+1452]mm · 13 of 85 slices shown, 15 images]
[im 6/85  soft-tissue]
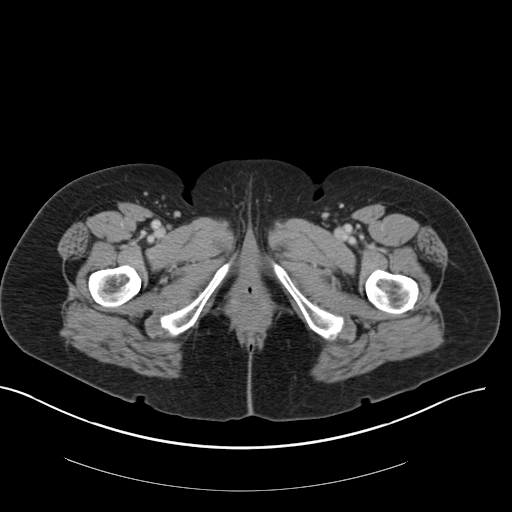
[im 6/85  bone]
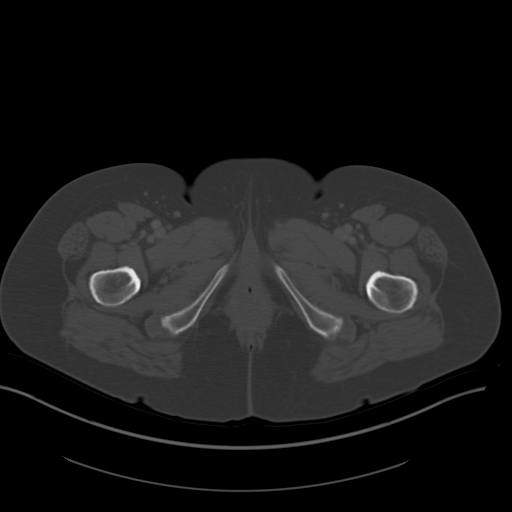
[im 12/85  soft-tissue]
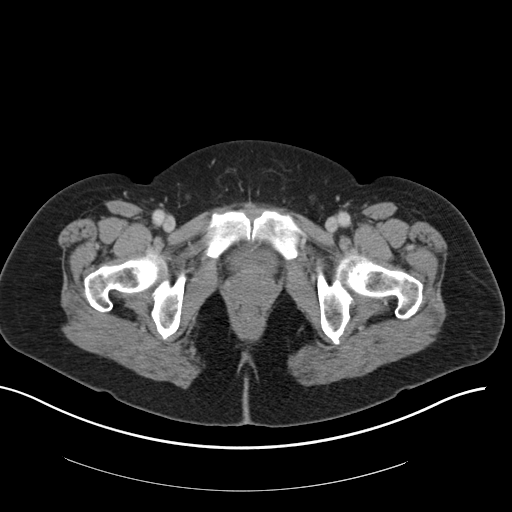
[im 17/85  soft-tissue]
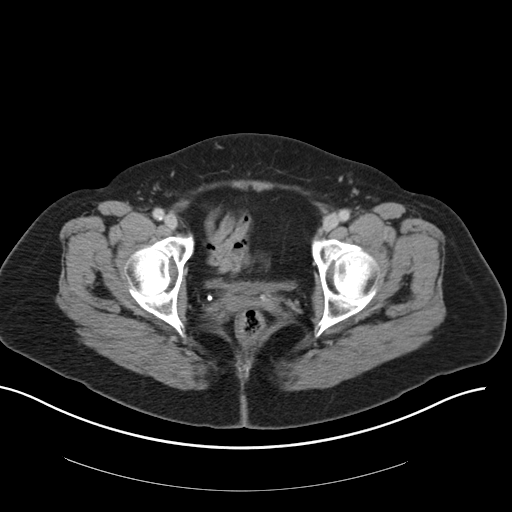
[im 23/85  soft-tissue]
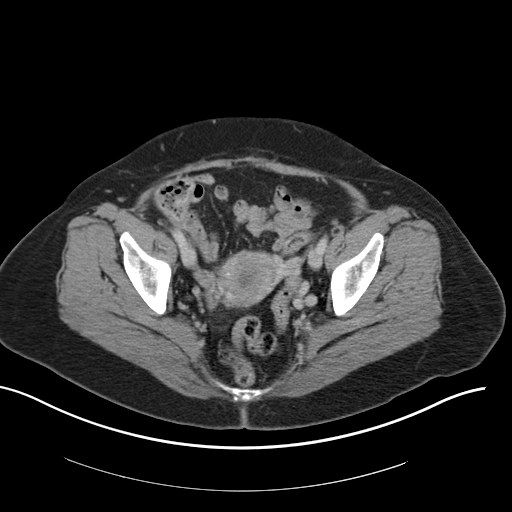
[im 29/85  soft-tissue]
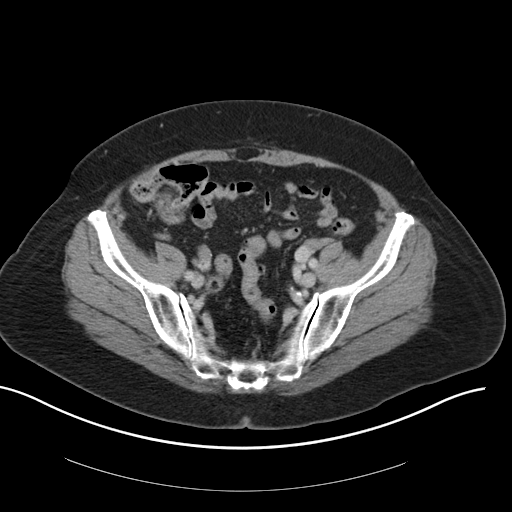
[im 34/85  soft-tissue]
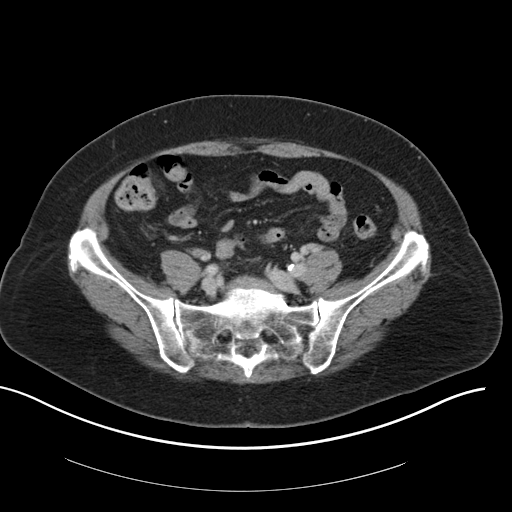
[im 45/85  soft-tissue]
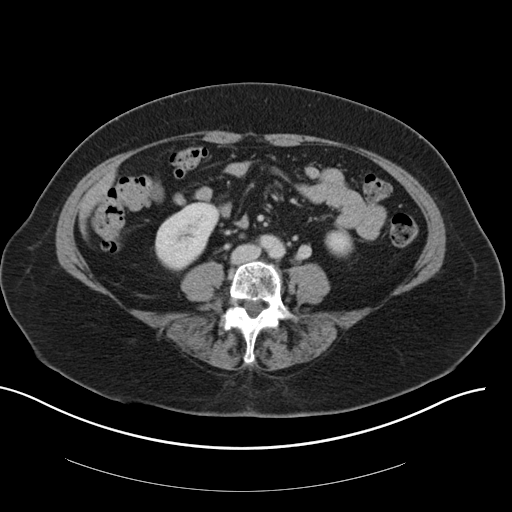
[im 51/85  soft-tissue]
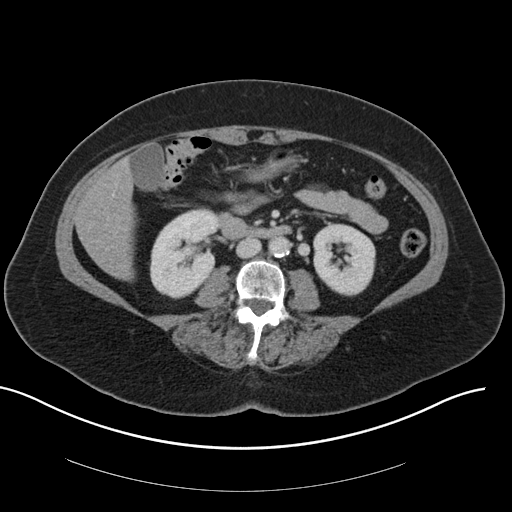
[im 57/85  soft-tissue]
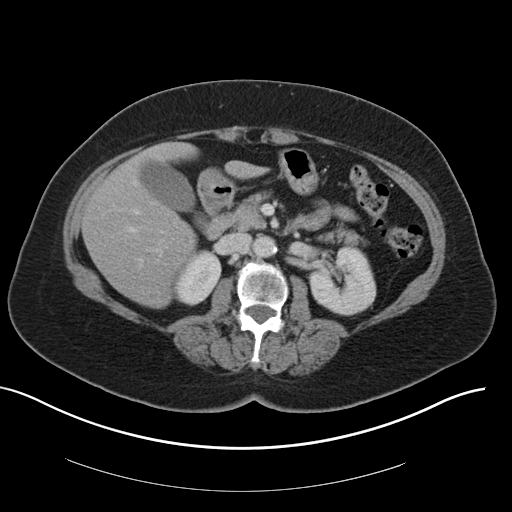
[im 57/85  bone]
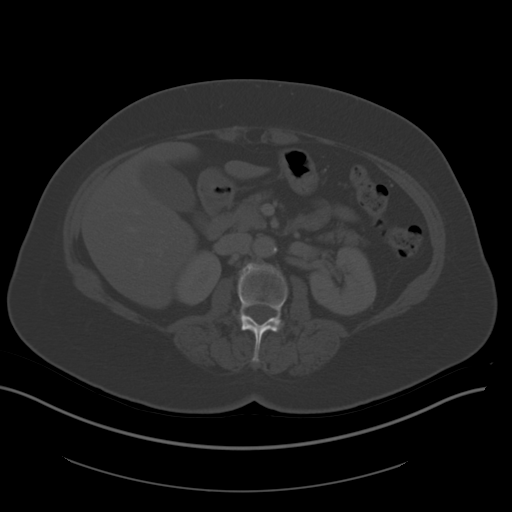
[im 62/85  soft-tissue]
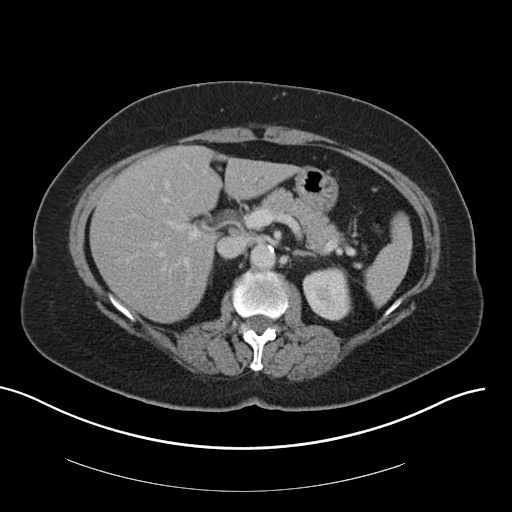
[im 68/85  soft-tissue]
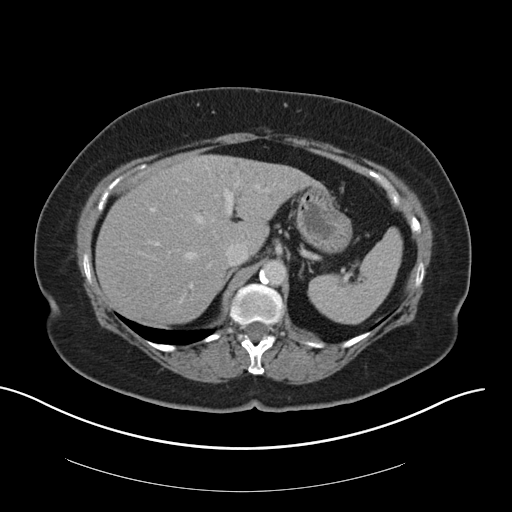
[im 73/85  soft-tissue]
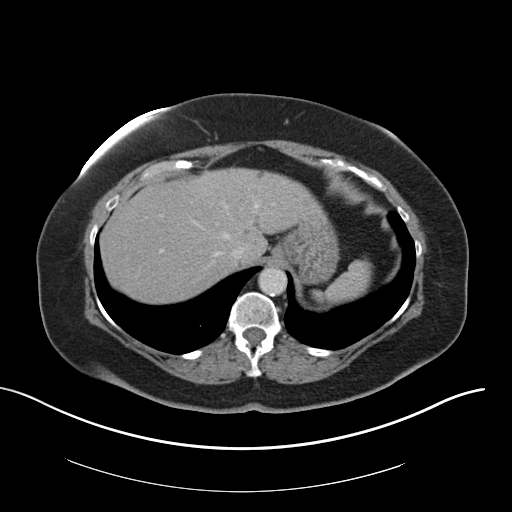
[im 79/85  soft-tissue]
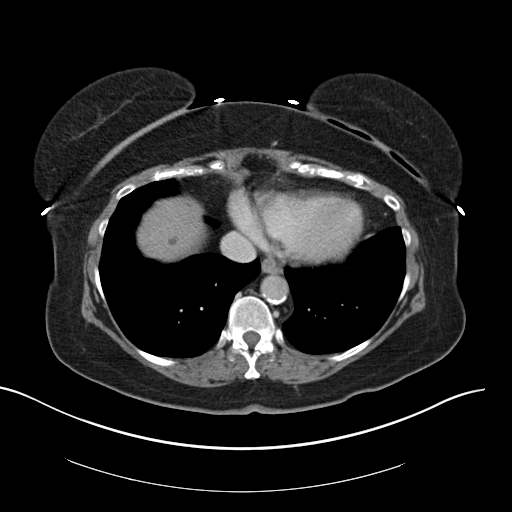

[Series 5: coronal st · coronal · 0.73mm/px · 3 of 151 slices shown]
[im 51/151  soft-tissue]
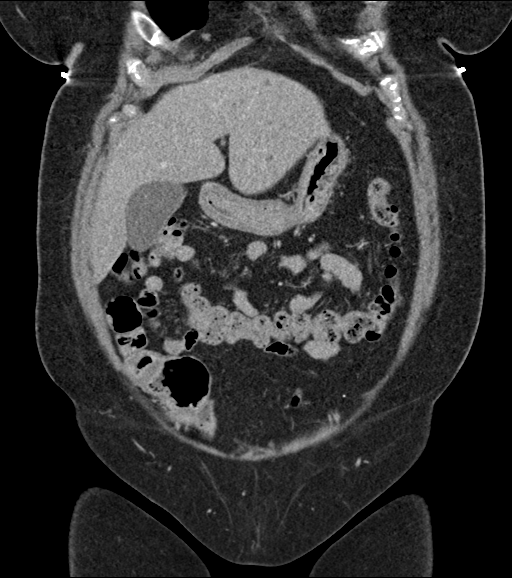
[im 67/151  soft-tissue]
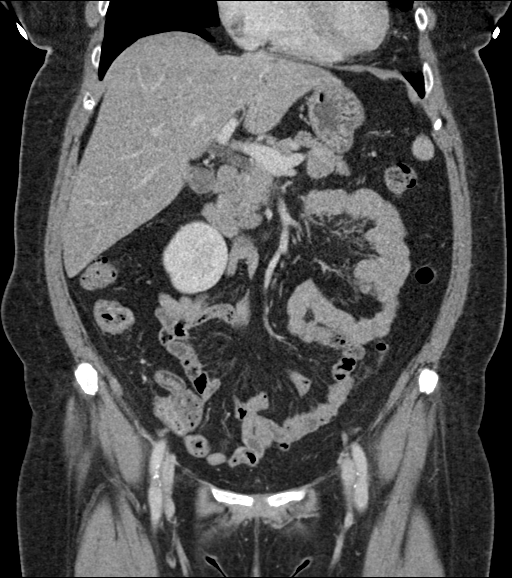
[im 84/151  soft-tissue]
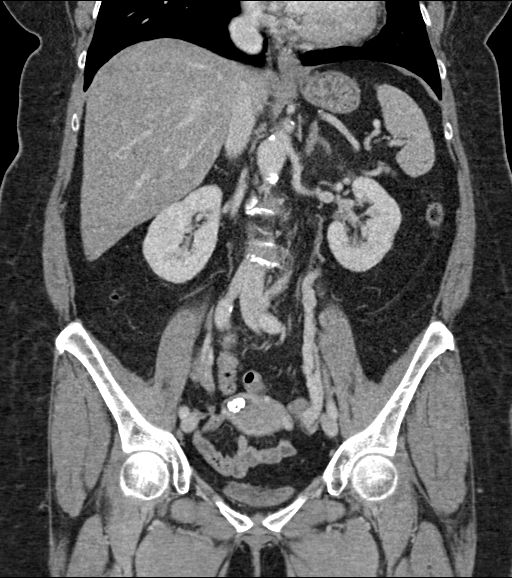

[16 of 46 positions shown; findings below may reference images not displayed]

FINDINGS: Lower chest: No acute abnormality.

Hepatobiliary: There are multiple tiny hypodense hepatic lesions,
most prominent the left hepatic lobe, too small to characterize but
statistically likely to be cysts. No gallstones, gallbladder wall
thickening, or biliary dilatation.

Pancreas: Unremarkable. No pancreatic ductal dilatation or
surrounding inflammatory changes.

Spleen: Normal in size without focal abnormality.

Adrenals/Urinary Tract: Adrenal glands are unremarkable. No
hydronephrosis or nephrolithiasis. Tiny left renal cyst. The bladder
is decompressed.

Stomach/Bowel: The stomach is within normal limits. There is no
evidence of bowel obstruction. The appendix is normal. Colonic
diverticulosis. No evidence of acute diverticulitis.

Vascular/Lymphatic: Aortoiliac atherosclerotic calcifications. No
AAA. No enlarged abdominal or pelvic lymph nodes.

Reproductive: Endometrial thickening. Fundal calcified uterine
fibroid.

Other: No abdominal wall hernia or abnormality. No abdominopelvic
ascites.

Musculoskeletal: No acute osseous abnormality. No suspicious lytic
or blastic lesions. Multilevel degenerative changes of the spine,
most severe at L4-L5 and L5-S1.
IMPRESSION: No acute abdominopelvic abnormality.  Normal appendix.

Colonic diverticulosis.  No evidence of acute diverticulitis.

Endometrial thickening, which is considered abnormal if
postmenopausal. Non-emergent pelvic ultrasound is recommended.

Multiple tiny hypodense hepatic lesions, too small to characterize
but statistically likely to be cysts in the absence of a known
malignancy.

## 2023-08-01 ENCOUNTER — Other Ambulatory Visit: Payer: Self-pay | Admitting: Family Medicine

## 2023-08-01 DIAGNOSIS — Z1231 Encounter for screening mammogram for malignant neoplasm of breast: Secondary | ICD-10-CM

## 2023-08-11 ENCOUNTER — Encounter: Payer: Self-pay | Admitting: Family Medicine

## 2023-08-11 MED ORDER — LORAZEPAM 1 MG PO TABS: 0.5000 mg | ORAL_TABLET | Freq: Three times a day (TID) | ORAL | 0 refills | Status: AC | PRN

## 2023-08-11 NOTE — Addendum Note (Signed)
 Addended by: Sheliah Hatch on: 08/11/2023 04:20 PM   Modules accepted: Orders

## 2023-08-12 ENCOUNTER — Ambulatory Visit

## 2023-08-12 ENCOUNTER — Ambulatory Visit
Admission: RE | Admit: 2023-08-12 | Discharge: 2023-08-12 | Disposition: A | Discharge status: Home / Self Care | Source: Ambulatory Visit | Attending: Family Medicine | Admitting: Family Medicine

## 2023-08-12 DIAGNOSIS — Z1231 Encounter for screening mammogram for malignant neoplasm of breast: Secondary | ICD-10-CM

## 2023-08-22 IMAGING — US US PELVIS COMPLETE WITH TRANSVAGINAL
1 series · 15 of 25 positions shown · non-contrast
Comparison: CT abdomen pelvis 04/03/2021

CLINICAL DATA: Endometrial thickening seen on CT



[Series 1: us pelvis complete with transvaginal · 15 of 92 slices shown]
[im 1/92]
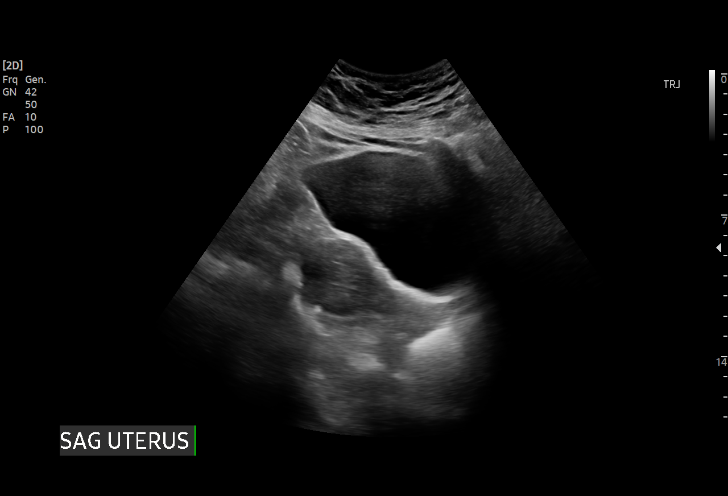
[im 8/92]
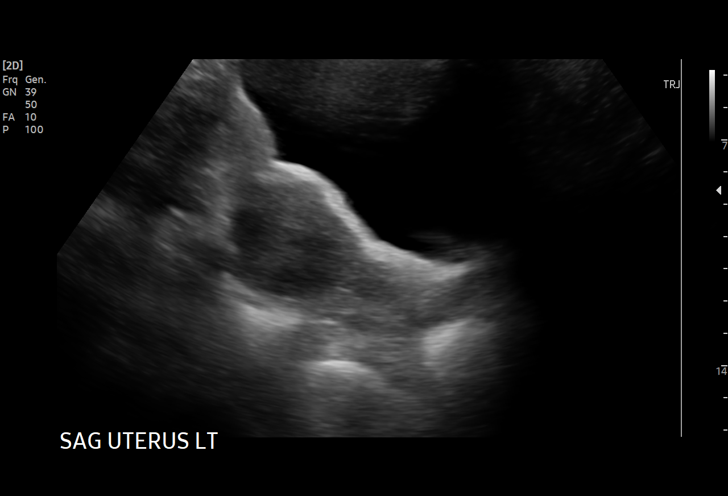
[im 16/92]
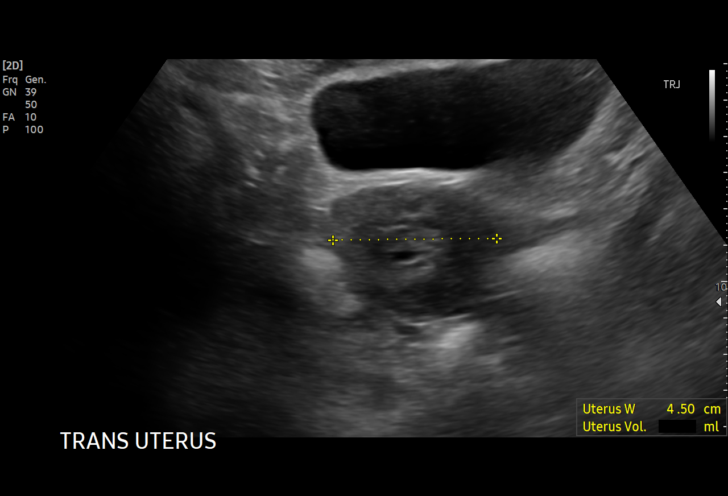
[im 19/92]
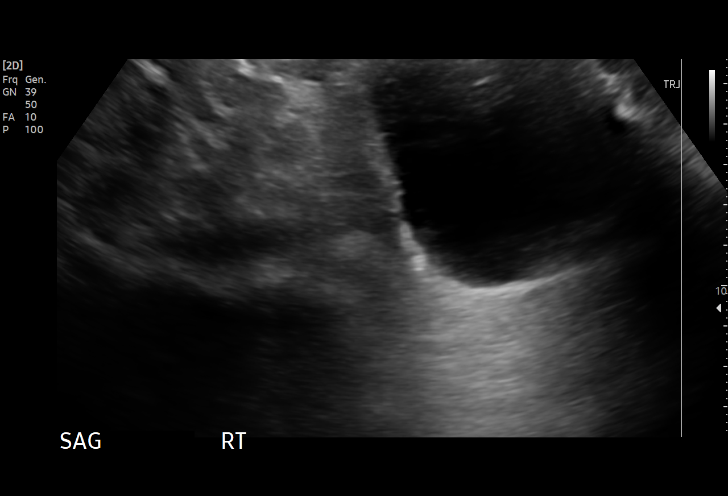
[im 27/92]
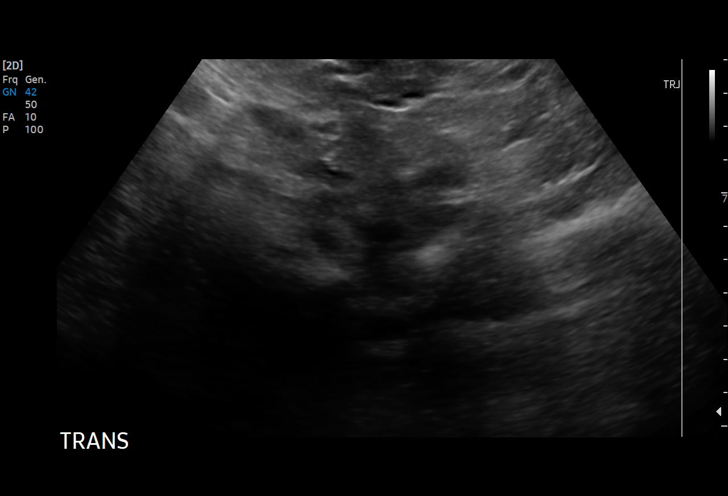
[im 35/92]
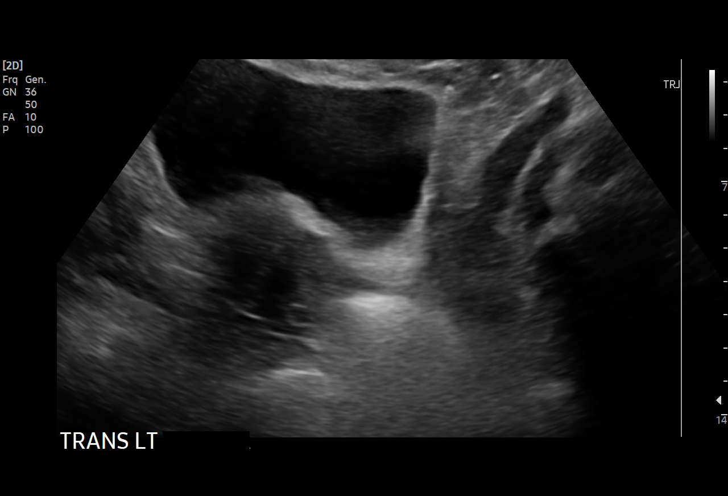
[im 38/92]
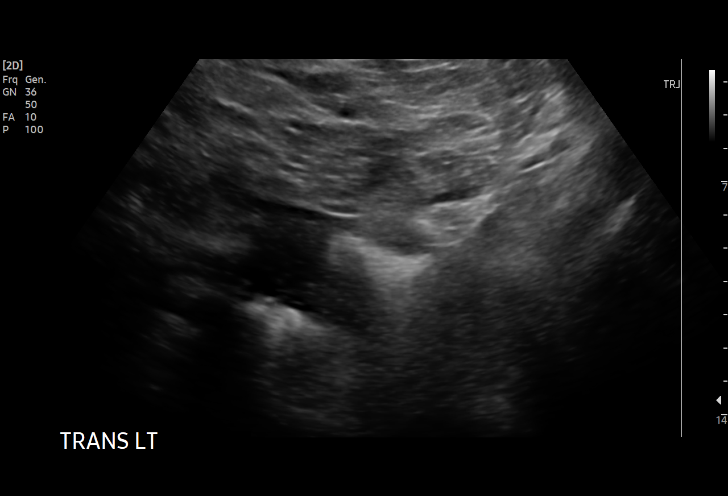
[im 46/92]
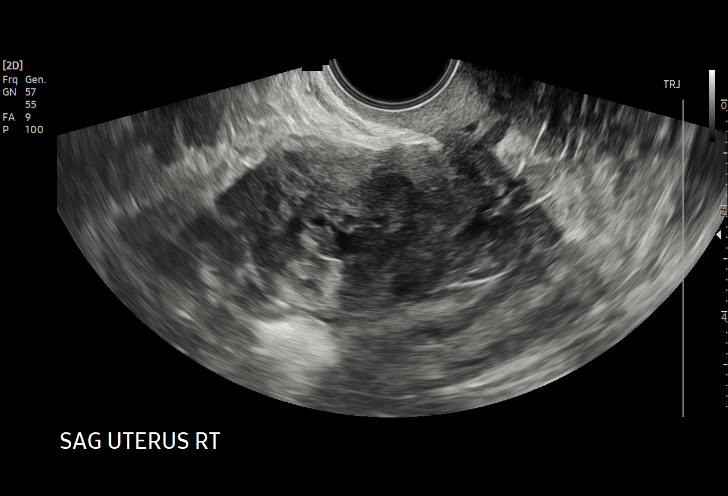
[im 54/92]
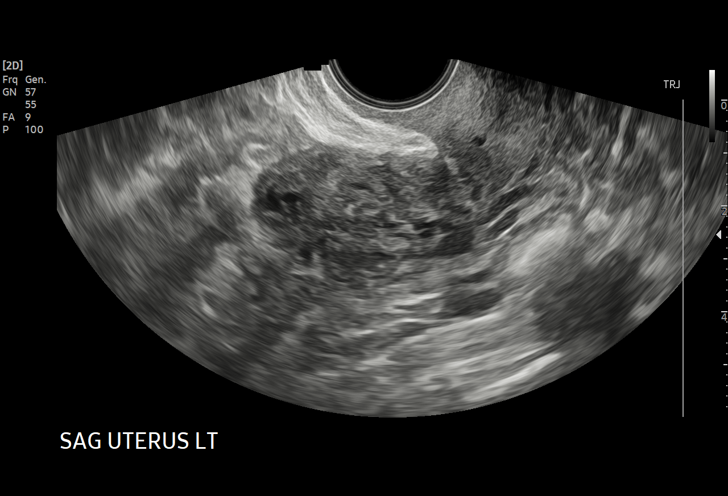
[im 57/92]
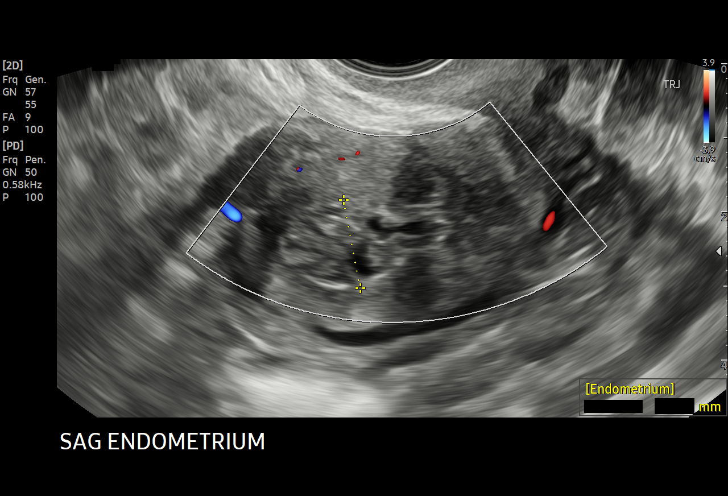
[im 65/92]
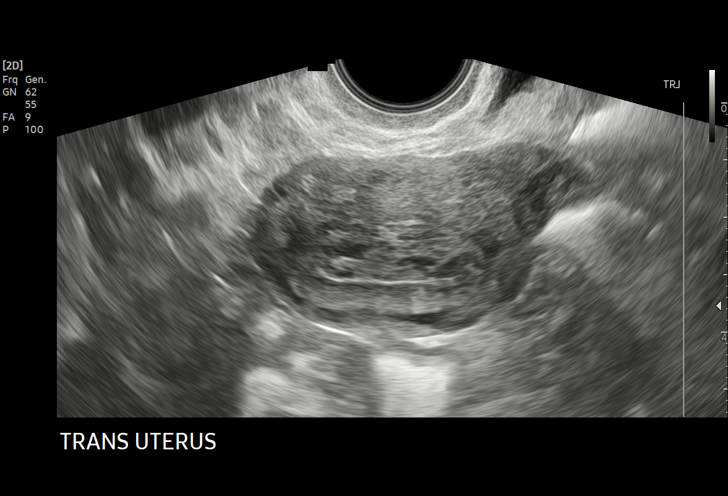
[im 73/92]
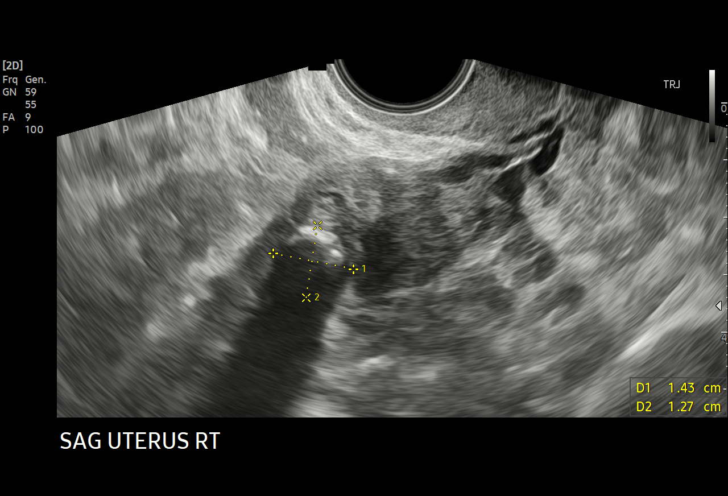
[im 76/92]
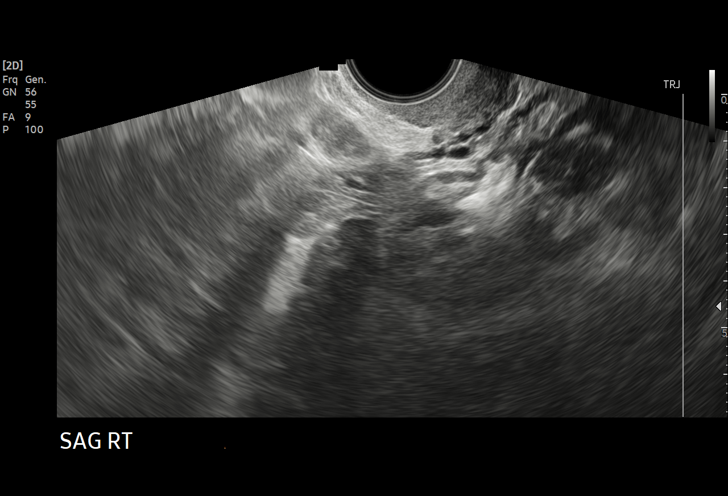
[im 84/92]
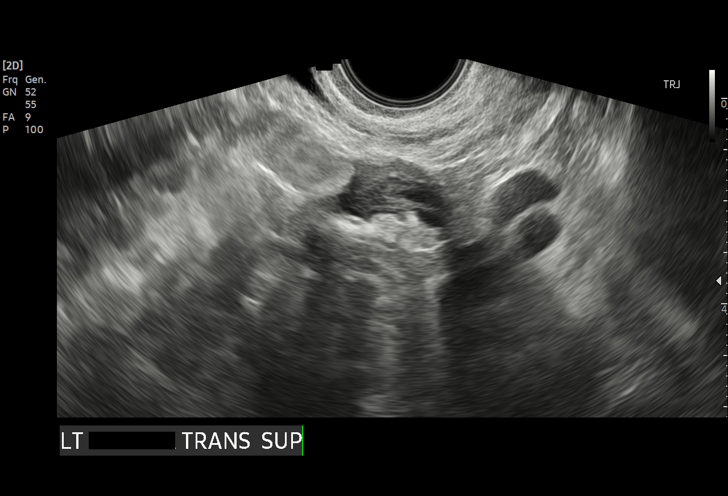
[im 92/92]
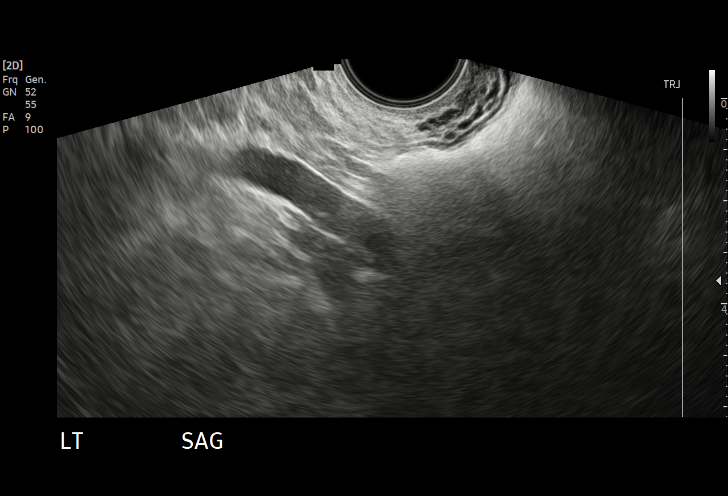

[15 of 25 positions shown; findings below may reference images not displayed]

FINDINGS: Uterus

Measurements: 7.1 x 3.4 x 5.0 cm = volume: 63 mL. Small calcified
fibroid seen in the right uterine fundus measuring 1.4 x 1.4 x
cm.

Endometrium

Thickness: 12 mm.  Diffusely heterogeneous and thickened.

Right ovary

Not visualized.

Left ovary

Not visualized.

Other findings

No abnormal free fluid.
IMPRESSION: Abnormally thickened heterogeneous endometrium measuring up to 12
mm. Findings are suspicious for uterine malignancy. Further
evaluation with tissue sampling should be considered.

## 2023-09-01 ENCOUNTER — Ambulatory Visit (INDEPENDENT_AMBULATORY_CARE_PROVIDER_SITE_OTHER): Payer: Managed Care, Other (non HMO) | Admitting: Family Medicine

## 2023-09-01 ENCOUNTER — Encounter: Payer: Self-pay | Admitting: Family Medicine

## 2023-09-01 VITALS — BP 108/64 | HR 82 | Temp 98.2°F | Ht 63.0 in | Wt 138.1 lb

## 2023-09-01 DIAGNOSIS — E559 Vitamin D deficiency, unspecified: Secondary | ICD-10-CM | POA: Diagnosis not present

## 2023-09-01 DIAGNOSIS — Z0000 Encounter for general adult medical examination without abnormal findings: Secondary | ICD-10-CM | POA: Diagnosis not present

## 2023-09-01 DIAGNOSIS — E782 Mixed hyperlipidemia: Secondary | ICD-10-CM | POA: Diagnosis not present

## 2023-09-01 LAB — HEPATIC FUNCTION PANEL
ALT: 12 U/L (ref 0–35)
AST: 16 U/L (ref 0–37)
Albumin: 4.9 g/dL (ref 3.5–5.2)
Alkaline Phosphatase: 61 U/L (ref 39–117)
Bilirubin, Direct: 0.2 mg/dL (ref 0.0–0.3)
Total Bilirubin: 1.5 mg/dL — ABNORMAL HIGH (ref 0.2–1.2)
Total Protein: 7.5 g/dL (ref 6.0–8.3)

## 2023-09-01 LAB — CBC WITH DIFFERENTIAL/PLATELET
Basophils Absolute: 0 10*3/uL (ref 0.0–0.1)
Basophils Relative: 0.4 % (ref 0.0–3.0)
Eosinophils Absolute: 0.1 10*3/uL (ref 0.0–0.7)
Eosinophils Relative: 1.7 % (ref 0.0–5.0)
HCT: 40.8 % (ref 36.0–46.0)
Hemoglobin: 13.7 g/dL (ref 12.0–15.0)
Lymphocytes Relative: 27.3 % (ref 12.0–46.0)
Lymphs Abs: 1.6 10*3/uL (ref 0.7–4.0)
MCHC: 33.5 g/dL (ref 30.0–36.0)
MCV: 82.4 fl (ref 78.0–100.0)
Monocytes Absolute: 0.4 10*3/uL (ref 0.1–1.0)
Monocytes Relative: 6.9 % (ref 3.0–12.0)
Neutro Abs: 3.7 10*3/uL (ref 1.4–7.7)
Neutrophils Relative %: 63.7 % (ref 43.0–77.0)
Platelets: 216 10*3/uL (ref 150.0–400.0)
RBC: 4.95 Mil/uL (ref 3.87–5.11)
RDW: 14.2 % (ref 11.5–15.5)
WBC: 5.8 10*3/uL (ref 4.0–10.5)

## 2023-09-01 LAB — BASIC METABOLIC PANEL WITH GFR
BUN: 14 mg/dL (ref 6–23)
CO2: 29 meq/L (ref 19–32)
Calcium: 9.9 mg/dL (ref 8.4–10.5)
Chloride: 103 meq/L (ref 96–112)
Creatinine, Ser: 0.61 mg/dL (ref 0.40–1.20)
GFR: 94.23 mL/min (ref >60.00)
Glucose, Bld: 69 mg/dL — ABNORMAL LOW (ref 70–99)
Potassium: 3.9 meq/L (ref 3.5–5.1)
Sodium: 139 meq/L (ref 135–145)

## 2023-09-01 LAB — LIPID PANEL
Cholesterol: 200 mg/dL (ref 0–200)
HDL: 46.8 mg/dL (ref >39.00)
LDL Cholesterol: 132 mg/dL — ABNORMAL HIGH (ref 0–99)
NonHDL: 153.25
Total CHOL/HDL Ratio: 4
Triglycerides: 108 mg/dL (ref 0.0–149.0)
VLDL: 21.6 mg/dL (ref 0.0–40.0)

## 2023-09-01 LAB — VITAMIN D 25 HYDROXY (VIT D DEFICIENCY, FRACTURES): VITD: 37 ng/mL (ref 30.00–100.00)

## 2023-09-01 LAB — TSH: TSH: 1.32 u[IU]/mL (ref 0.35–5.50)

## 2023-09-01 NOTE — Progress Notes (Signed)
   Subjective:    Patient ID: Erin Williamson, female    DOB: Jun 30, 1960, 63 y.o.   MRN: [id number]  HPI CPE- UTD on mammo, pap, colonoscopy, Tdap  Patient Care Team    Relationship Specialty Notifications Start End  Sheliah Hatch, MD PCP - General Family Medicine  12/21/18     Health Maintenance  Topic Date Due   INFLUENZA VACCINE  12/26/2023   MAMMOGRAM  08/11/2025   Cervical Cancer Screening (HPV/Pap Cotest)  05/11/2026   Colonoscopy  08/18/2026   DTaP/Tdap/Td (2 - Td or Tdap) 02/10/2031   Hepatitis C Screening  Completed   HIV Screening  Completed   Zoster Vaccines- Shingrix  Completed   HPV VACCINES  Aged Out   COVID-19 Vaccine  Discontinued      Review of Systems Patient reports no vision/ hearing changes, adenopathy,fever, persistant/recurrent hoarseness , swallowing issues, chest pain, palpitations, edema, persistant/recurrent cough, hemoptysis, dyspnea (rest/exertional/paroxysmal nocturnal), gastrointestinal bleeding (melena, rectal bleeding), abdominal pain, significant heartburn, bowel changes, GU symptoms (dysuria, hematuria, incontinence), Gyn symptoms (abnormal  bleeding, pain),  syncope, focal weakness, memory loss, numbness & tingling, skin/hair/nail changes, abnormal bruising or bleeding, anxiety, or depression.   + 50 lb weight loss    Objective:   Physical Exam General Appearance:    Alert, cooperative, no distress, appears stated age  Head:    Normocephalic, without obvious abnormality, atraumatic  Eyes:    PERRL, conjunctiva/corneas clear, EOM's intact both eyes  Ears:    Normal TM's and external ear canals, both ears  Nose:   Nares normal, septum midline, mucosa normal, no drainage    or sinus tenderness  Throat:   Lips, mucosa, and tongue normal; teeth and gums normal  Neck:   Supple, symmetrical, trachea midline, no adenopathy;    Thyroid: no enlargement/tenderness/nodules  Back:     Symmetric, no curvature, ROM normal, no CVA tenderness  Lungs:      Clear to auscultation bilaterally, respirations unlabored  Chest Wall:    No tenderness or deformity   Heart:    Regular rate and rhythm, S1 and S2 normal, no murmur, rub   or gallop  Breast Exam:    Deferred to GYN  Abdomen:     Soft, non-tender, bowel sounds active all four quadrants,    no masses, no organomegaly  Genitalia:    Deferred to GYN  Rectal:    Extremities:   Extremities normal, atraumatic, no cyanosis or edema  Pulses:   2+ and symmetric all extremities  Skin:   Skin color, texture, turgor normal, no rashes or lesions  Lymph nodes:   Cervical, supraclavicular, and axillary nodes normal  Neurologic:   CNII-XII intact, normal strength, sensation and reflexes    throughout          Assessment & Plan:

## 2023-09-01 NOTE — Assessment & Plan Note (Signed)
 Pt's PE WNL- she is down 50 lbs!!  UTD on mammo, pap, colonoscopy, Tdap.  Check labs.  Anticipatory guidance provided.

## 2023-09-01 NOTE — Patient Instructions (Signed)
 Follow up in 1 year or as needed We'll notify you of your lab results and make any changes if needed Keep up the good work on healthy diet and regular exercise- you look AMAZING!!! Call with any questions or concerns Stay Safe! Stay Healthy! Happy Spring!!!

## 2023-09-02 ENCOUNTER — Telehealth: Payer: Self-pay

## 2023-09-02 ENCOUNTER — Encounter: Payer: Self-pay | Admitting: Family Medicine

## 2023-09-02 NOTE — Telephone Encounter (Signed)
-----   Message from Neena Rhymes sent at 09/02/2023  8:02 AM EDT ----- Labs look good!  No changes at this time  (Please note- I may not comment on every lab value that is noted as abnormal.  Trust when I say I review all of them, compare them to previous values, and put them in the correct medical context.  If I have any concerns- or any follow up or changes are needed- I promise to let you know)

## 2023-09-02 NOTE — Telephone Encounter (Signed)
 Pt has reviewed labs via MyChart

## 2024-01-19 ENCOUNTER — Other Ambulatory Visit: Payer: Self-pay | Admitting: Physician Assistant

## 2024-01-19 ENCOUNTER — Telehealth: Payer: Self-pay | Admitting: Orthopaedic Surgery

## 2024-01-19 ENCOUNTER — Telehealth: Payer: Self-pay

## 2024-01-19 MED ORDER — MELOXICAM 7.5 MG PO TABS: 7.5000 mg | ORAL_TABLET | Freq: Every day | ORAL | 0 refills | Status: DC | PRN

## 2024-01-19 NOTE — Telephone Encounter (Signed)
 Pt reports she has knee right knee pain. Pt refused an appt Pt asked why Dr.Tabori could not send in this medication. I explained to pt this is not a medication Dr.Tabori has prescribed in the past and with her having new sx she needs to be seen Pt states she has appt with surgeon 9/2 and will wing it until then. I did suggest to call the office of her surgeon and discuss her concerns

## 2024-01-19 NOTE — Telephone Encounter (Signed)
 Copied from CRM [id number]. Topic: Clinical - Medication Question >> Jan 19, 2024 10:20 AM Thersia BROCKS wrote: Reason for CRM: Patient called in regarding prescription meloxicam  (MOBIC ) 15 MG tablet wanted to know if she could get Dr.Tabori to refill this medication for her

## 2024-01-19 NOTE — Telephone Encounter (Signed)
 sent

## 2024-01-19 NOTE — Telephone Encounter (Signed)
 FYI

## 2024-01-19 NOTE — Telephone Encounter (Signed)
 Pt called requesting a refill of meloxicam . Please send to [facility]. Pt phone number is [phone].

## 2024-01-27 ENCOUNTER — Telehealth: Payer: Self-pay | Admitting: Radiology

## 2024-01-27 ENCOUNTER — Telehealth: Payer: Self-pay

## 2024-01-27 ENCOUNTER — Other Ambulatory Visit (INDEPENDENT_AMBULATORY_CARE_PROVIDER_SITE_OTHER)

## 2024-01-27 ENCOUNTER — Telehealth: Payer: Self-pay | Admitting: Family Medicine

## 2024-01-27 ENCOUNTER — Ambulatory Visit (INDEPENDENT_AMBULATORY_CARE_PROVIDER_SITE_OTHER): Admitting: Orthopaedic Surgery

## 2024-01-27 DIAGNOSIS — M1711 Unilateral primary osteoarthritis, right knee: Secondary | ICD-10-CM

## 2024-01-27 NOTE — Telephone Encounter (Signed)
 Placed in folder Appt has been scheduled

## 2024-01-27 NOTE — Telephone Encounter (Signed)
 Type of form received: Surgical Clearance  Additional comments: Pt has appt on 9/5  Received by: Fax  Form should be Faxed/mailed to: (address/ fax #) [phone]  Is patient requesting call for pickup: N/A  Form placed:  Labeled & placed in provider bin  Attach charge sheet.  Provider will determine charge.  Individual made aware of 3-5 business day turn around? N/A

## 2024-01-27 NOTE — Telephone Encounter (Signed)
 Patient given surgical clearance form for PCP. Aware that we must receive clearance form back before being able to proceed with scheduling surgery.

## 2024-01-27 NOTE — Telephone Encounter (Signed)
 Patient called requesting email address to sent FMLA paper work due to being scheduled for surgery.   Advised patient that paperwork now has to be dropped off in person, signed in and there is a processing fee. Patient stated she has did not have to do this over a year ago.   Please return call if patient should bring paper work in or preferred email address to send.   Thanks.   Call back number is listed in chart.

## 2024-01-27 NOTE — Progress Notes (Signed)
 Office Visit Note   Patient: Erin Williamson           Date of Birth: 1961/03/29           MRN: [id number] Visit Date: 01/27/2024              Requested by: Mahlon Comer BRAVO, MD [address],  [place] [address] PCP: Mahlon Comer BRAVO, MD   Assessment & Plan: Visit Diagnoses:  1. Primary osteoarthritis of right knee     Plan: History of Present Illness Erin Williamson is a 63 year old female who presents with right knee pain and locking.  She experiences clicking and locking in the right knee, with recent intensification leading to significant pain and inability to unlock the knee during a dressing incident. She uses meloxicam  regularly for pain management and applies a left leg brace on the right knee for stability. Her left knee, replaced in February 2024, functions well with occasional pain when lying on her right side and lifting her left leg.  Physical Exam MUSCULOSKELETAL: Right knee range of motion normal. No effusion in right knee.  Medial joint line tenderness.  Results RADIOLOGY Knee X-ray: Severe osteoarthritis with bone-on-bone contact  Assessment and Plan Right knee osteoarthritis Chronic right knee osteoarthritis with bone-on-bone arthritis causing mechanical symptoms. Conservative measures ineffective. Significant pain and functional limitations noted. - Schedule right knee replacement surgery for October post-move. - Obtain surgical clearance from Dr. Jarold. - Use nickel-free implant for knee replacement. - Plan spinal anesthesia for procedure. - Coordinate with [name] for scheduling and pre-operative planning. - Ensure submission of surgical clearance form to Dr. Jarold.  Impression is severe right knee degenerative joint disease secondary to Osteoarthritis.  Patient has attempted conservative treatment for at least 6 consecutive weeks within the past 12 weeks, including but not limited to physical therapy, home exercise program, NSAIDs,  activity modification, and/or corticosteroid injections. Despite these efforts, symptoms have not improved or have worsened. Conservative measures have been deemed unsuccessful at this time. After a detailed discussion covering diagnosis and treatment options--including the risks, benefits, alternatives, and potential complications of surgical and nonsurgical management--the patient elected to proceed with surgery  Anticoagulants: No antithrombotic Postop anticoagulation: Aspirin  81 mg Diabetic: No  Nickel allergy: Yes Prior DVT/PE: No Tobacco use: No Clearances needed for surgery: PCP Anticipated discharge dispo: Home   Follow-Up Instructions: No follow-ups on file.   Orders:  Orders Placed This Encounter  Procedures   XR KNEE 3 VIEW RIGHT   No orders of the defined types were placed in this encounter.   Subjective: Chief Complaint  Patient presents with   Right Knee - Pain    HPI  Review of Systems  Constitutional: Negative.   HENT: Negative.    Eyes: Negative.   Respiratory: Negative.    Cardiovascular: Negative.   Endocrine: Negative.   Musculoskeletal: Negative.   Neurological: Negative.   Hematological: Negative.   Psychiatric/Behavioral: Negative.    All other systems reviewed and are negative.    Objective: Vital Signs: There were no vitals taken for this visit.  Physical Exam Vitals and nursing note reviewed.  Constitutional:      Appearance: She is well-developed.  HENT:     Head: Atraumatic.     Nose: Nose normal.  Eyes:     Extraocular Movements: Extraocular movements intact.  Cardiovascular:     Pulses: Normal pulses.  Pulmonary:     Effort: Pulmonary effort is normal.  Abdominal:  Palpations: Abdomen is soft.  Musculoskeletal:     Cervical back: Neck supple.  Skin:    General: Skin is warm.     Capillary Refill: Capillary refill takes less than 2 seconds.  Neurological:     Mental Status: She is alert. Mental status is at baseline.   Psychiatric:        Behavior: Behavior normal.        Thought Content: Thought content normal.        Judgment: Judgment normal.     Ortho Exam  Specialty Comments:  No specialty comments available.  Imaging: XR KNEE 3 VIEW RIGHT Result Date: 01/27/2024 X-rays demonstrate severe tricompartmental osteoarthritis.  Bone-on-bone joint space narrowing of medial compartment.  Kellgren-Lawrence stage IV    PMFS History: Patient Active Problem List   Diagnosis Date Noted   Status post total left knee replacement 07/23/2022   Primary osteoarthritis of left knee 07/22/2022   Vitreous floaters of right eye 10/28/2021   HTN (hypertension) 05/22/2021   Endometrial thickening on ultrasound 04/26/2021   Physical exam 06/23/2019   Vitamin D  deficiency 06/23/2019   Family history of colon cancer 12/21/2018   Generalized anxiety disorder 12/21/2018   Hyperlipidemia 12/21/2018   Obesity (BMI 30-39.9) 12/21/2018   Past Medical History:  Diagnosis Date   Anxiety    Arthritis    GERD (gastroesophageal reflux disease)    Hyperlipidemia    Hypertension    Migraine    Osteoarthritis    left and right knee   PONV (postoperative nausea and vomiting)     Family History  Problem Relation Age of Onset   COPD Mother    Heart attack Mother    Heart disease Mother    Hyperlipidemia Mother    Hypertension Mother    Early death Father    Stroke Sister    Arthritis Sister    Colon cancer Sister    Mental illness Son    Rectal cancer Neg Hx    Stomach cancer Neg Hx    Esophageal cancer Neg Hx    Liver cancer Neg Hx    Pancreatic cancer Neg Hx     Past Surgical History:  Procedure Laterality Date   CESAREAN SECTION     1987 and 1998   TOTAL KNEE ARTHROPLASTY Left 07/23/2022   Procedure: LEFT TOTAL KNEE ARTHROPLASTY;  Surgeon: Jerri Kay HERO, MD;  Location: [facility] OR;  Service: Orthopedics;  Laterality: Left;   tummy tuck  2000   Social History   Occupational History   Not on file   Tobacco Use   Smoking status: Former    Current packs/day: 0.00    Types: Cigarettes    Quit date: 05/27/1988    Years since quitting: 35.6   Smokeless tobacco: Never  Vaping Use   Vaping status: Never Used  Substance and Sexual Activity   Alcohol use: Yes    Comment: maybe 1 drink once a month   Drug use: Never   Sexual activity: Not Currently    Birth control/protection: None

## 2024-01-27 NOTE — Telephone Encounter (Signed)
 IC, lmvm advising can email forms to me however, will still need to come to office to complete authorization and pay the $20 form fee. Advised these are needed prior to completing forms.

## 2024-01-30 ENCOUNTER — Ambulatory Visit: Admitting: Family Medicine

## 2024-01-30 ENCOUNTER — Encounter: Payer: Self-pay | Admitting: Family Medicine

## 2024-01-30 VITALS — BP 138/74 | HR 71 | Temp 98.0°F | Resp 18 | Ht 63.0 in | Wt 130.6 lb

## 2024-01-30 DIAGNOSIS — Z01818 Encounter for other preprocedural examination: Secondary | ICD-10-CM

## 2024-01-30 LAB — HEPATIC FUNCTION PANEL
ALT: 14 U/L (ref 0–35)
AST: 18 U/L (ref 0–37)
Albumin: 4.6 g/dL (ref 3.5–5.2)
Alkaline Phosphatase: 52 U/L (ref 39–117)
Bilirubin, Direct: 0.1 mg/dL (ref 0.0–0.3)
Total Bilirubin: 1.1 mg/dL (ref 0.2–1.2)
Total Protein: 7.3 g/dL (ref 6.0–8.3)

## 2024-01-30 LAB — CBC WITH DIFFERENTIAL/PLATELET
Basophils Absolute: 0 K/uL (ref 0.0–0.1)
Basophils Relative: 0.1 % (ref 0.0–3.0)
Eosinophils Absolute: 0.1 K/uL (ref 0.0–0.7)
Eosinophils Relative: 1.3 % (ref 0.0–5.0)
HCT: 38.5 % (ref 36.0–46.0)
Hemoglobin: 12.8 g/dL (ref 12.0–15.0)
Lymphocytes Relative: 32.2 % (ref 12.0–46.0)
Lymphs Abs: 1.5 K/uL (ref 0.7–4.0)
MCHC: 33.2 g/dL (ref 30.0–36.0)
MCV: 81.6 fl (ref 78.0–100.0)
Monocytes Absolute: 0.4 K/uL (ref 0.1–1.0)
Monocytes Relative: 8.9 % (ref 3.0–12.0)
Neutro Abs: 2.7 K/uL (ref 1.4–7.7)
Neutrophils Relative %: 57.5 % (ref 43.0–77.0)
Platelets: 231 K/uL (ref 150.0–400.0)
RBC: 4.71 Mil/uL (ref 3.87–5.11)
RDW: 14.1 % (ref 11.5–15.5)
WBC: 4.7 K/uL (ref 4.0–10.5)

## 2024-01-30 LAB — BASIC METABOLIC PANEL WITH GFR
BUN: 15 mg/dL (ref 6–23)
CO2: 31 meq/L (ref 19–32)
Calcium: 9.7 mg/dL (ref 8.4–10.5)
Chloride: 102 meq/L (ref 96–112)
Creatinine, Ser: 0.59 mg/dL (ref 0.40–1.20)
GFR: 94.71 mL/min (ref >60.00)
Glucose, Bld: 72 mg/dL (ref 70–99)
Potassium: 4.2 meq/L (ref 3.5–5.1)
Sodium: 141 meq/L (ref 135–145)

## 2024-01-30 LAB — TSH: TSH: 1.35 u[IU]/mL (ref 0.35–5.50)

## 2024-01-30 NOTE — Progress Notes (Signed)
 Subjective:    Erin Williamson is a 63 y.o. female who presents to the office today for a preoperative consultation at the request of surgeon Dr Jerri who plans on performing R total knee on date TBD. This consultation is requested for the specific conditions prompting preoperative evaluation (i.e. because of potential affect on operative risk): HTN. Planned anesthesia: TBD. The patient has the following known anesthesia issues: no hx of anesthesia problems. Patients bleeding risk: no recent abnormal bleeding and no remote history of abnormal bleeding. Patient does not have objections to receiving blood products if needed.  The following portions of the patient's history were reviewed and updated as appropriate: allergies, current medications, past family history, past medical history, past social history, past surgical history, and problem list.  Review of Systems A comprehensive review of systems was negative.    Objective:    BP 138/74   Pulse 71   Temp 98 F (36.7 C) (Temporal)   Resp 18   Ht 5' 3 (1.6 m)   Wt 130 lb 9.6 oz (59.2 kg)   SpO2 99%   BMI 23.13 kg/m   General Appearance:    Alert, cooperative, no distress, appears stated age  Head:    Normocephalic, without obvious abnormality, atraumatic  Eyes:    PERRL, conjunctiva/corneas clear, EOM's intact both eyes  Ears:    Normal TM's and external ear canals, both ears  Nose:   Nares normal, septum midline, mucosa normal, no drainage    or sinus tenderness  Throat:   Lips, mucosa, and tongue normal; teeth and gums normal  Neck:   Supple, symmetrical, trachea midline, no adenopathy;    thyroid :  no enlargement/tenderness/nodules  Back:     Symmetric, no curvature, ROM normal, no CVA tenderness  Lungs:     Clear to auscultation bilaterally, respirations unlabored  Chest Wall:    No tenderness or deformity   Heart:    Regular rate and rhythm, S1 and S2 normal, no murmur, rub   or gallop  Breast Exam:    deferred  Abdomen:      Soft, non-tender, bowel sounds active all four quadrants,    no masses, no organomegaly  Genitalia:    deferred  Rectal:    Extremities:   Extremities normal, atraumatic, no cyanosis or edema  Pulses:   2+ and symmetric all extremities  Skin:   Skin color, texture, turgor normal, no rashes or lesions  Lymph nodes:   Cervical, supraclavicular, and axillary nodes normal  Neurologic:   CNII-XII intact, normal strength, sensation and reflexes    throughout    Predictors of intubation difficulty:  Morbid obesity? no  Anatomically abnormal facies? no  Prominent incisors? no  Receding mandible? no  Short, thick neck? no  Neck range of motion: normal  Dentition: Missing teeth (bridge work fell out)  Cardiographics ECG: normal sinus rhythm, no blocks or conduction defects, no ischemic changes Echocardiogram: not done  Imaging Chest x-ray: NA   Lab Review  pending    Assessment:      63 y.o. female with planned surgery as above.   Known risk factors for perioperative complications: None   Difficulty with intubation is not anticipated.  Cardiac Risk Estimation: low  Current medications which may produce withdrawal symptoms if withheld perioperatively: none    Plan:    1. Preoperative workup as follows ECG, hemoglobin, hematocrit, electrolytes, creatinine, glucose, liver function studies. 2. Change in medication regimen before surgery: discontinue NSAIDs (Meloxicam ) 14 days before  surgery. 3. Prophylaxis for cardiac events with perioperative beta-blockers: not indicated. 4. Invasive hemodynamic monitoring perioperatively: at the discretion of anesthesiologist. 5. Deep vein thrombosis prophylaxis postoperatively:regimen to be chosen by surgical team. 6. Surveillance for postoperative MI with ECG immediately postoperatively and on postoperative days 1 and 2 AND troponin levels 24 hours postoperatively and on day 4 or hospital discharge (whichever comes first): at the discretion of  anesthesiologist. 7. Other measures: none

## 2024-01-30 NOTE — Patient Instructions (Signed)
 Follow up as needed or as scheduled We'll notify you of your lab results and make any changes if needed Keep up the good work on healthy diet and regular exercise- you look great! We'll fax your surgical clearance to the Ortho office so they can get you scheduled Call with any questions or concerns Stay Safe!  Stay Healthy! [facility] With Surgery!!

## 2024-02-02 ENCOUNTER — Ambulatory Visit: Payer: Self-pay | Admitting: Family Medicine

## 2024-02-02 NOTE — Telephone Encounter (Signed)
 Faxed back.

## 2024-02-02 NOTE — Telephone Encounter (Signed)
 Forms signed and returned to [facility]

## 2024-02-04 ENCOUNTER — Ambulatory Visit: Admitting: Family Medicine

## 2024-02-05 ENCOUNTER — Telehealth: Payer: Self-pay | Admitting: Orthopaedic Surgery

## 2024-02-05 NOTE — Telephone Encounter (Signed)
 Received

## 2024-02-05 NOTE — Telephone Encounter (Signed)
 Pt submitted medical relaeease forms short term forms and $20. payment

## 2024-02-10 ENCOUNTER — Encounter: Payer: Self-pay | Admitting: Orthopaedic Surgery

## 2024-02-15 ENCOUNTER — Other Ambulatory Visit: Payer: Self-pay | Admitting: Physician Assistant

## 2024-02-19 ENCOUNTER — Telehealth: Payer: Self-pay | Admitting: Orthopaedic Surgery

## 2024-02-19 NOTE — Telephone Encounter (Signed)
 Received call from patient checking on her disability forms. I advised [employer] forms faxed on 09/11. She stated they have not received. I re-faxed and emailed patient a copy [email or web address] per pts request.

## 2024-02-20 ENCOUNTER — Telehealth: Payer: Self-pay | Admitting: Orthopaedic Surgery

## 2024-02-20 NOTE — Telephone Encounter (Signed)
 Called patient. No answer. LMOM. Handicap form ready for pick up.

## 2024-02-20 NOTE — Telephone Encounter (Signed)
Yes 6 months

## 2024-02-20 NOTE — Telephone Encounter (Signed)
 Pt came in office requesting a handicap placard. Please call pt when ready for pick up. Pt phone number is [phone].

## 2024-03-19 ENCOUNTER — Other Ambulatory Visit: Payer: Self-pay | Admitting: Physician Assistant

## 2024-03-19 MED ORDER — DOCUSATE SODIUM 100 MG PO CAPS: 100.0000 mg | ORAL_CAPSULE | Freq: Every day | ORAL | 2 refills | Status: AC | PRN

## 2024-03-19 MED ORDER — ONDANSETRON HCL 4 MG PO TABS: 4.0000 mg | ORAL_TABLET | Freq: Three times a day (TID) | ORAL | 0 refills | Status: AC | PRN

## 2024-03-19 MED ORDER — METHOCARBAMOL 750 MG PO TABS: 750.0000 mg | ORAL_TABLET | Freq: Three times a day (TID) | ORAL | 2 refills | Status: AC | PRN

## 2024-03-19 MED ORDER — OXYCODONE-ACETAMINOPHEN 5-325 MG PO TABS: 1.0000 | ORAL_TABLET | Freq: Four times a day (QID) | ORAL | 0 refills | Status: DC | PRN

## 2024-03-22 NOTE — Pre-Procedure Instructions (Addendum)
 Surgical Instructions   Your procedure is scheduled on March 29, 2024. Report to [facility] Main Entrance A at 7:30 A.M., then check in with the Admitting office. Any questions or running late day of surgery: call [phone]  Questions prior to your surgery date: call [phone], Monday-Friday, 8am-4pm. If you experience any cold or flu symptoms such as cough, fever, chills, shortness of breath, etc. between now and your scheduled surgery, please notify us  at the above number.     Remember:  Do not eat after midnight the night before your surgery  You may drink clear liquids until 7:00 AM the morning of your surgery.   Clear liquids allowed are: Water , Non-Citrus Juices (without pulp), Carbonated Beverages, Clear Tea (no milk, honey, etc.), Black Coffee Only (NO MILK, CREAM OR POWDERED CREAMER of any kind), and Gatorade.  Patient Instructions  The night before surgery:  No food after midnight. ONLY clear liquids after midnight  The day of surgery (if you do NOT have diabetes):  Drink ONE (1) Pre-Surgery Clear Ensure by 7:00 AM the morning of surgery. Drink in one sitting. Do not sip.  This drink was given to you during your hospital  pre-op appointment visit.  Nothing else to drink after completing the  Pre-Surgery Clear Ensure.         If you have questions, please contact your surgeon's office.    Take these medicines the morning of surgery with A SIP OF WATER : NONE   May take these medicines IF NEEDED: LORazepam  (ATIVAN )  Polyethyl Glycol-Propyl Glycol (SYSTANE) eye drops   STOP taking your tirzepatide (ZEPBOUND) one week prior to surgery. DO NOT take any doses after October 22nd.   One week prior to surgery, STOP taking any Aspirin  (unless otherwise instructed by your surgeon) Aleve, Naproxen, Ibuprofen, Motrin, Advil, Goody's, BC's, all herbal medications, fish oil, and non-prescription vitamins. This includes your medication: meloxicam  (MOBIC )                       Do NOT Smoke (Tobacco/Vaping) for 24 hours prior to your procedure.  If you use a CPAP at night, you may bring your mask/headgear for your overnight stay.   You will be asked to remove any contacts, glasses, piercing's, hearing aid's, dentures/partials prior to surgery. Please bring cases for these items if needed.    Patients discharged the day of surgery will not be allowed to drive home, and someone needs to stay with them for 24 hours.  SURGICAL WAITING ROOM VISITATION Patients may have no more than 2 support people in the waiting area - these visitors may rotate.   Pre-op nurse will coordinate an appropriate time for 1 ADULT support person, who may not rotate, to accompany patient in pre-op.  Children under the age of 36 must have an adult with them who is not the patient and must remain in the main waiting area with an adult.  If the patient needs to stay at the hospital during part of their recovery, the visitor guidelines for inpatient rooms apply.  Please refer to the [facility] website for the visitor guidelines for any additional information.   If you received a COVID test during your pre-op visit  it is requested that you wear a mask when out in public, stay away from anyone that may not be feeling well and notify your surgeon if you develop symptoms. If you have been in contact with anyone that has tested positive in the last  10 days please notify you surgeon.      Pre-operative 4 CHG Bathing Instructions   You can play a key role in reducing the risk of infection after surgery. Your skin needs to be as free of germs as possible. You can reduce the number of germs on your skin by washing with CHG (chlorhexidine  gluconate) soap before surgery. CHG is an antiseptic soap that kills germs and continues to kill germs even after washing.   DO NOT use if you have an allergy to chlorhexidine /CHG or antibacterial soaps. If your skin becomes reddened or irritated, stop using  the CHG and notify one of our RNs at [phone].   Please shower with the CHG soap starting 4 days before surgery using the following schedule:     Please keep in mind the following:  DO NOT shave, including legs and underarms, starting the day of your first shower.   You may shave your face at any point before/day of surgery.  Place clean sheets on your bed the day you start using CHG soap. Use a clean washcloth (not used since being washed) for each shower. DO NOT sleep with pets once you start using the CHG.   CHG Shower Instructions:  Wash your face and private area with normal soap. If you choose to wash your hair, wash first with your normal shampoo.  After you use shampoo/soap, rinse your hair and body thoroughly to remove shampoo/soap residue.  Turn the water  OFF and apply  bottle of CHG soap to a CLEAN washcloth.  Apply CHG soap ONLY FROM YOUR NECK DOWN TO YOUR TOES (washing for 3-5 minutes)  DO NOT use CHG soap on face, private areas, open wounds, or sores.  Pay special attention to the area where your surgery is being performed.  If you are having back surgery, having someone wash your back for you may be helpful. Wait 2 minutes after CHG soap is applied, then you may rinse off the CHG soap.  Pat dry with a clean towel  Put on clean clothes/pajamas   If you choose to wear lotion, please use ONLY the CHG-compatible lotions that are listed below.  Additional instructions for the day of surgery:  If you choose, you may shower the morning of surgery with an antibacterial soap.  DO NOT APPLY any lotions, deodorants, cologne, or perfumes.   Do not bring valuables to the hospital. [facility] is not responsible for any belongings/valuables. Do not wear nail polish, gel polish, artificial nails, or any other type of covering on natural nails (fingers and toes) Do not wear jewelry or makeup Put on clean/comfortable clothes.  Please brush your teeth.  Ask your nurse before  applying any prescription medications to the skin.     CHG Compatible Lotions   Aveeno Moisturizing lotion  Cetaphil Moisturizing Cream  Cetaphil Moisturizing Lotion  Clairol Herbal Essence Moisturizing Lotion, Dry Skin  Clairol Herbal Essence Moisturizing Lotion, Extra Dry Skin  Clairol Herbal Essence Moisturizing Lotion, Normal Skin  Curel Age Defying Therapeutic Moisturizing Lotion with Alpha Hydroxy  Curel Extreme Care Body Lotion  Curel Soothing Hands Moisturizing Hand Lotion  Curel Therapeutic Moisturizing Cream, Fragrance-Free  Curel Therapeutic Moisturizing Lotion, Fragrance-Free  Curel Therapeutic Moisturizing Lotion, Original Formula  Eucerin Daily Replenishing Lotion  Eucerin Dry Skin Therapy Plus Alpha Hydroxy Crme  Eucerin Dry Skin Therapy Plus Alpha Hydroxy Lotion  Eucerin Original Crme  Eucerin Original Lotion  Eucerin Plus Crme Eucerin Plus Lotion  Eucerin TriLipid Replenishing Lotion  Keri Anti-Bacterial Hand Lotion  Keri Deep Conditioning Original Lotion Dry Skin Formula Softly Scented  Keri Deep Conditioning Original Lotion, Fragrance Free Sensitive Skin Formula  Keri Lotion Fast Absorbing Fragrance Free Sensitive Skin Formula  Keri Lotion Fast Absorbing Softly Scented Dry Skin Formula  Keri Original Lotion  Keri Skin Renewal Lotion Keri Silky Smooth Lotion  Keri Silky Smooth Sensitive Skin Lotion  Nivea Body Creamy Conditioning Oil  Nivea Body Extra Enriched Lotion  Nivea Body Original Lotion  Nivea Body Sheer Moisturizing Lotion Nivea Crme  Nivea Skin Firming Lotion  NutraDerm 30 Skin Lotion  NutraDerm Skin Lotion  NutraDerm Therapeutic Skin Cream  NutraDerm Therapeutic Skin Lotion  ProShield Protective Hand Cream  Provon moisturizing lotion  Please read over the following fact sheets that you were given.

## 2024-03-23 ENCOUNTER — Encounter (HOSPITAL_COMMUNITY): Payer: Self-pay

## 2024-03-23 ENCOUNTER — Other Ambulatory Visit: Payer: Self-pay

## 2024-03-23 ENCOUNTER — Encounter (HOSPITAL_COMMUNITY)
Admission: RE | Admit: 2024-03-23 | Discharge: 2024-03-23 | Disposition: A | Discharge status: Home / Self Care | Source: Ambulatory Visit | Attending: Orthopaedic Surgery | Admitting: Orthopaedic Surgery

## 2024-03-23 VITALS — BP 155/84 | HR 69 | Temp 97.8°F | Resp 18 | Ht 63.0 in | Wt 127.0 lb

## 2024-03-23 DIAGNOSIS — I10 Essential (primary) hypertension: Secondary | ICD-10-CM | POA: Insufficient documentation

## 2024-03-23 DIAGNOSIS — K219 Gastro-esophageal reflux disease without esophagitis: Secondary | ICD-10-CM | POA: Insufficient documentation

## 2024-03-23 DIAGNOSIS — Z01812 Encounter for preprocedural laboratory examination: Secondary | ICD-10-CM | POA: Diagnosis present

## 2024-03-23 DIAGNOSIS — M170 Bilateral primary osteoarthritis of knee: Secondary | ICD-10-CM | POA: Diagnosis not present

## 2024-03-23 DIAGNOSIS — Z87891 Personal history of nicotine dependence: Secondary | ICD-10-CM | POA: Insufficient documentation

## 2024-03-23 DIAGNOSIS — Z01818 Encounter for other preprocedural examination: Secondary | ICD-10-CM

## 2024-03-23 DIAGNOSIS — Z96652 Presence of left artificial knee joint: Secondary | ICD-10-CM | POA: Insufficient documentation

## 2024-03-23 DIAGNOSIS — M1711 Unilateral primary osteoarthritis, right knee: Secondary | ICD-10-CM

## 2024-03-23 LAB — BASIC METABOLIC PANEL WITH GFR
Anion gap: 6 (ref 5–15)
BUN: 16 mg/dL (ref 8–23)
CO2: 29 mmol/L (ref 22–32)
Calcium: 9.6 mg/dL (ref 8.9–10.3)
Chloride: 105 mmol/L (ref 98–111)
Creatinine, Ser: 0.62 mg/dL (ref 0.44–1.00)
GFR, Estimated: >60 mL/min (ref >60)
Glucose, Bld: 84 mg/dL (ref 70–99)
Potassium: 4 mmol/L (ref 3.5–5.1)
Sodium: 140 mmol/L (ref 135–145)

## 2024-03-23 LAB — SURGICAL PCR SCREEN

## 2024-03-23 LAB — CBC
HCT: 41.2 % (ref 36.0–46.0)
Hemoglobin: 13.6 g/dL (ref 12.0–15.0)
MCH: 27.3 pg (ref 26.0–34.0)
MCHC: 33 g/dL (ref 30.0–36.0)
MCV: 82.7 fL (ref 80.0–100.0)
Platelets: 220 K/uL (ref 150–400)
RBC: 4.98 MIL/uL (ref 3.87–5.11)
RDW: 14 % (ref 11.5–15.5)
WBC: 5.2 K/uL (ref 4.0–10.5)
nRBC: 0 % (ref 0.0–0.2)

## 2024-03-23 NOTE — Progress Notes (Signed)
 PCP - Mahlon Craven, MD Cardiologist - denies  PPM/ICD - denies Device Orders - n/a Rep Notified - n/a  Chest x-ray - denies EKG - 01/30/2024 Stress Test - denies ECHO - denies Cardiac Cath - denies  Sleep Study - denies CPAP - n/a  Fasting Blood Sugar -no DM  Checks Blood Sugar _____ times a day  Last dose of GLP1 agonist-  03/20/24 GLP1 instructions: to hold for 1 week  Blood Thinner Instructions: n/a Aspirin  Instructions: n/a  ERAS Protcol -yes PRE-SURGERY Ensure or G2- Ensure  COVID TEST- n/a   Anesthesia review: yes, medical clearance (01/30/2024)  Patient denies shortness of breath, fever, cough and chest pain at PAT appointment   All instructions explained to the patient, with a verbal understanding of the material. Patient agrees to go over the instructions while at home for a better understanding. Patient also instructed to self quarantine after being tested for COVID-19. The opportunity to ask questions was provided.

## 2024-03-24 NOTE — Anesthesia Preprocedure Evaluation (Addendum)
 Anesthesia Evaluation  Patient identified by MRN, date of birth, ID band Patient awake    Reviewed: Allergy & Precautions, H&P , NPO status , Patient's Chart, lab work & pertinent test results  History of Anesthesia Complications (+) PONV and history of anesthetic complications  Airway Mallampati: II  TM Distance: >3 FB Neck ROM: Full    Dental no notable dental hx.    Pulmonary neg sleep apnea, neg COPD, former smoker   Pulmonary exam normal breath sounds clear to auscultation       Cardiovascular hypertension, (-) angina (-) Past MI Normal cardiovascular exam Rhythm:Regular Rate:Normal     Neuro/Psych neg Seizures PSYCHIATRIC DISORDERS Anxiety     negative neurological ROS     GI/Hepatic Neg liver ROS,GERD  ,,  Endo/Other  negative endocrine ROS    Renal/GU negative Renal ROS  negative genitourinary   Musculoskeletal  (+) Arthritis ,    Abdominal   Peds negative pediatric ROS (+)  Hematology negative hematology ROS (+)   Anesthesia Other Findings   Reproductive/Obstetrics negative OB ROS                              Anesthesia Physical Anesthesia Plan  ASA: 2  Anesthesia Plan: Regional and Spinal   Post-op Pain Management:    Induction:   PONV Risk Score and Plan: 3 and Treatment may vary due to age or medical condition and TIVA  Airway Management Planned: Natural Airway and Simple Face Mask  Additional Equipment:   Intra-op Plan:   Post-operative Plan:   Informed Consent: I have reviewed the patients History and Physical, chart, labs and discussed the procedure including the risks, benefits and alternatives for the proposed anesthesia with the patient or authorized representative who has indicated his/her understanding and acceptance.     Dental advisory given  Plan Discussed with: CRNA  Anesthesia Plan Comments: (PAT note written 03/24/2024 by Allison Zelenak,  PA-C.  )         Anesthesia Quick Evaluation

## 2024-03-24 NOTE — Progress Notes (Signed)
 Anesthesia Chart Review:  Case: [id number] Date/Time: 03/29/24 1000   Procedure: ARTHROPLASTY, KNEE, TOTAL (Left: Knee)   Anesthesia type: Spinal   Diagnosis: Primary osteoarthritis of left knee [M17.12]   Pre-op diagnosis: RIGHT KNEE OSTEOARTHRITIS   [facility]OM 09 / [facility]   Surgeons: Jerri Kay HERO, MD       DISCUSSION: Patient is a 63 year old female scheduled for the above procedure.  History includes former smoker (quit 1990), postoperative, HTN, HLD, GERD, migraines, osteoarthritis (left TKA 07/23/2022)  Preoperative medical evaluation on 01/30/2024 with Mahlon Comer BRAVO, MD with EKG and labs. Note reviewed. Patient classified as low risk (scanned under Media tab).  Last Zepbound 03/20/2024.   Anesthesia team to evaluate on the day of surgery.  Surgical PCR result was invalid so we will need recollection on the day of surgery.   VS: BP (!) 155/84   Pulse 69   Temp 36.6 C   Resp 18   Ht 5' 3 (1.6 m)   Wt 57.6 kg   SpO2 100%   BMI 22.50 kg/m   PROVIDERS: Mahlon Comer BRAVO, MD is PCP   LABS: Labs reviewed: Acceptable for surgery.  TSH and LFTs normal on 01/30/2024. (all labs ordered are listed, but only abnormal results are displayed)  Labs Reviewed  SURGICAL PCR SCREEN - Abnormal; Notable for the following components:      Result Value   MRSA, PCR   (*)    Value: INVALID, UNABLE TO DETERMINE THE PRESENCE OF TARGET DUE TO SPECIMEN INTEGRITY. RECOLLECTION REQUESTED.   Staphylococcus aureus   (*)    Value: INVALID, UNABLE TO DETERMINE THE PRESENCE OF TARGET DUE TO SPECIMEN INTEGRITY. RECOLLECTION REQUESTED.   All other components within normal limits  CBC  BASIC METABOLIC PANEL WITH GFR     IMAGES: Xray right knee 01/27/2024: X-rays demonstrate severe tricompartmental osteoarthritis. Bone-on-bone  joint space narrowing of medial compartment.  Kellgren-Lawrence stage IV    EKG: 01/30/2024: NSR   CV: N/A  Past Medical History:  Diagnosis Date    Anxiety    Arthritis    GERD (gastroesophageal reflux disease)    Hyperlipidemia    Hypertension    Migraine    Osteoarthritis    left and right knee   PONV (postoperative nausea and vomiting)     Past Surgical History:  Procedure Laterality Date   CESAREAN SECTION     1987 and 1998   TOTAL KNEE ARTHROPLASTY Left 07/23/2022   Procedure: LEFT TOTAL KNEE ARTHROPLASTY;  Surgeon: Jerri Kay HERO, MD;  Location: [facility];  Service: Orthopedics;  Laterality: Left;   tummy tuck  2000    MEDICATIONS:  docusate sodium  (COLACE) 100 MG capsule   methocarbamol  (ROBAXIN ) 750 MG tablet   ondansetron  (ZOFRAN ) 4 MG tablet   oxyCODONE -acetaminophen  (PERCOCET) 5-325 MG tablet   ibuprofen (ADVIL) 200 MG tablet   LORazepam  (ATIVAN ) 1 MG tablet   MAGNESIUM PO   meloxicam  (MOBIC ) 7.5 MG tablet   Polyethyl Glycol-Propyl Glycol (SYSTANE) 0.4-0.3 % SOLN   tirzepatide (ZEPBOUND) 15 MG/0.5ML Pen   No current facility-administered medications for this encounter.    Isaiah Ruder, PA-C Surgical Short Stay/Anesthesiology [facility] Phone [phone] [facility] Phone [phone] 03/24/2024 2:36 PM

## 2024-03-24 NOTE — Progress Notes (Signed)
 Surgical PCR result invalid. Will need to be recollected DOS. Order placed.

## 2024-03-26 MED ORDER — SODIUM CHLORIDE 0.9 % IV SOLN
2000.0000 mg | INTRAVENOUS | Status: AC
  Filled 2024-03-26 (×2): qty 20

## 2024-03-29 ENCOUNTER — Ambulatory Visit (HOSPITAL_BASED_OUTPATIENT_CLINIC_OR_DEPARTMENT_OTHER): Admitting: Anesthesiology

## 2024-03-29 ENCOUNTER — Ambulatory Visit (HOSPITAL_COMMUNITY): Payer: Self-pay | Admitting: Vascular Surgery

## 2024-03-29 ENCOUNTER — Encounter (HOSPITAL_COMMUNITY)
Admission: RE | Disposition: A | Discharge status: Home / Self Care | Payer: Self-pay | Source: Home / Self Care | Attending: Orthopaedic Surgery

## 2024-03-29 ENCOUNTER — Encounter: Payer: Self-pay | Admitting: Radiology

## 2024-03-29 ENCOUNTER — Ambulatory Visit (HOSPITAL_COMMUNITY)

## 2024-03-29 ENCOUNTER — Other Ambulatory Visit: Payer: Self-pay | Admitting: Physician Assistant

## 2024-03-29 ENCOUNTER — Observation Stay (HOSPITAL_COMMUNITY)
Admission: RE | Admit: 2024-03-29 | Discharge: 2024-03-30 | Disposition: A | Discharge status: Home / Self Care | Attending: Orthopaedic Surgery | Admitting: Orthopaedic Surgery

## 2024-03-29 DIAGNOSIS — Z79899 Other long term (current) drug therapy: Secondary | ICD-10-CM | POA: Diagnosis not present

## 2024-03-29 DIAGNOSIS — M1711 Unilateral primary osteoarthritis, right knee: Secondary | ICD-10-CM

## 2024-03-29 DIAGNOSIS — Z96652 Presence of left artificial knee joint: Secondary | ICD-10-CM | POA: Insufficient documentation

## 2024-03-29 DIAGNOSIS — I10 Essential (primary) hypertension: Secondary | ICD-10-CM | POA: Insufficient documentation

## 2024-03-29 DIAGNOSIS — M1712 Unilateral primary osteoarthritis, left knee: Secondary | ICD-10-CM | POA: Diagnosis not present

## 2024-03-29 DIAGNOSIS — M25561 Pain in right knee: Secondary | ICD-10-CM | POA: Diagnosis present

## 2024-03-29 DIAGNOSIS — Z96651 Presence of right artificial knee joint: Secondary | ICD-10-CM

## 2024-03-29 DIAGNOSIS — Z87891 Personal history of nicotine dependence: Secondary | ICD-10-CM | POA: Diagnosis not present

## 2024-03-29 DIAGNOSIS — Z01818 Encounter for other preprocedural examination: Secondary | ICD-10-CM

## 2024-03-29 HISTORY — PX: TOTAL KNEE ARTHROPLASTY: SHX125

## 2024-03-29 LAB — GLUCOSE, CAPILLARY: Glucose-Capillary: 151 mg/dL — ABNORMAL HIGH (ref 70–99)

## 2024-03-29 LAB — SURGICAL PCR SCREEN
MRSA, PCR: NEGATIVE
Staphylococcus aureus: NEGATIVE

## 2024-03-29 SURGERY — ARTHROPLASTY, KNEE, TOTAL
Anesthesia: Regional | Site: Knee | Laterality: Right

## 2024-03-29 MED ORDER — 0.9 % SODIUM CHLORIDE (POUR BTL) OPTIME
TOPICAL | Status: DC | PRN
  Administered 2024-03-29: 1000 mL

## 2024-03-29 MED ORDER — TRANEXAMIC ACID 1000 MG/10ML IV SOLN
INTRAVENOUS | Status: DC | PRN
  Administered 2024-03-29: 2000 mg via TOPICAL

## 2024-03-29 MED ORDER — CHLORHEXIDINE GLUCONATE 0.12 % MT SOLN
15.0000 mL | Freq: Once | OROMUCOSAL | Status: AC
  Administered 2024-03-29: 15 mL via OROMUCOSAL
  Filled 2024-03-29: qty 15

## 2024-03-29 MED ORDER — SODIUM CHLORIDE 0.9 % IR SOLN
Status: DC | PRN
  Administered 2024-03-29: 1000 mL

## 2024-03-29 MED ORDER — MIDAZOLAM HCL 2 MG/2ML IJ SOLN
INTRAMUSCULAR | Status: AC
  Filled 2024-03-29: qty 2

## 2024-03-29 MED ORDER — METHOCARBAMOL 500 MG PO TABS
500.0000 mg | ORAL_TABLET | Freq: Four times a day (QID) | ORAL | Status: DC | PRN
  Administered 2024-03-29 – 2024-03-30 (×3): 500 mg via ORAL
  Filled 2024-03-29 (×3): qty 1

## 2024-03-29 MED ORDER — FENTANYL CITRATE (PF) 100 MCG/2ML IJ SOLN
INTRAMUSCULAR | Status: AC
  Administered 2024-03-29: 50 ug
  Filled 2024-03-29: qty 2

## 2024-03-29 MED ORDER — HYDROMORPHONE HCL 1 MG/ML IJ SOLN: 1.0000 mg | Freq: Three times a day (TID) | INTRAMUSCULAR | Status: DC | PRN

## 2024-03-29 MED ORDER — VANCOMYCIN HCL 1000 MG IV SOLR
INTRAVENOUS | Status: AC
Start: 2024-03-29 — End: 2024-03-29
  Filled 2024-03-29: qty 20

## 2024-03-29 MED ORDER — DEXAMETHASONE SOD PHOSPHATE PF 10 MG/ML IJ SOLN
INTRAMUSCULAR | Status: DC | PRN
  Administered 2024-03-29: 6 mg via INTRAVENOUS

## 2024-03-29 MED ORDER — OXYCODONE HCL 5 MG PO TABS
5.0000 mg | ORAL_TABLET | Freq: Four times a day (QID) | ORAL | Status: DC | PRN
  Administered 2024-03-30: 5 mg via ORAL
  Filled 2024-03-29: qty 1

## 2024-03-29 MED ORDER — VANCOMYCIN HCL 1000 MG IV SOLR
INTRAVENOUS | Status: DC | PRN
  Administered 2024-03-29: 1000 mg via TOPICAL

## 2024-03-29 MED ORDER — DEXAMETHASONE SOD PHOSPHATE PF 10 MG/ML IJ SOLN
10.0000 mg | Freq: Once | INTRAMUSCULAR | Status: AC
  Administered 2024-03-30: 10 mg via INTRAVENOUS

## 2024-03-29 MED ORDER — LACTATED RINGERS IV SOLN: INTRAVENOUS | Status: DC

## 2024-03-29 MED ORDER — TRANEXAMIC ACID 1000 MG/10ML IV SOLN
2000.0000 mg | INTRAVENOUS | Status: DC
  Filled 2024-03-29 (×2): qty 20

## 2024-03-29 MED ORDER — LORAZEPAM 0.5 MG PO TABS
0.5000 mg | ORAL_TABLET | Freq: Three times a day (TID) | ORAL | Status: DC | PRN
Start: 2024-03-29 — End: 2024-03-30

## 2024-03-29 MED ORDER — MENTHOL 3 MG MT LOZG: 1.0000 | LOZENGE | OROMUCOSAL | Status: DC | PRN

## 2024-03-29 MED ORDER — OXYCODONE HCL 5 MG/5ML PO SOLN: 5.0000 mg | Freq: Once | ORAL | Status: DC | PRN

## 2024-03-29 MED ORDER — PRONTOSAN WOUND IRRIGATION OPTIME
TOPICAL | Status: DC | PRN
  Administered 2024-03-29: 1

## 2024-03-29 MED ORDER — METOCLOPRAMIDE HCL 5 MG PO TABS: 5.0000 mg | ORAL_TABLET | Freq: Three times a day (TID) | ORAL | Status: DC | PRN

## 2024-03-29 MED ORDER — ONDANSETRON HCL 4 MG PO TABS: 4.0000 mg | ORAL_TABLET | Freq: Four times a day (QID) | ORAL | Status: DC | PRN

## 2024-03-29 MED ORDER — DROPERIDOL 2.5 MG/ML IJ SOLN: 0.6250 mg | Freq: Once | INTRAMUSCULAR | Status: DC | PRN

## 2024-03-29 MED ORDER — CEFAZOLIN SODIUM-DEXTROSE 2-4 GM/100ML-% IV SOLN
2.0000 g | Freq: Four times a day (QID) | INTRAVENOUS | Status: AC
  Administered 2024-03-29 (×2): 2 g via INTRAVENOUS
  Filled 2024-03-29 (×2): qty 100

## 2024-03-29 MED ORDER — BUPIVACAINE-MELOXICAM ER 400-12 MG/14ML IJ SOLN
INTRAMUSCULAR | Status: DC | PRN
  Administered 2024-03-29: 400 mg

## 2024-03-29 MED ORDER — POVIDONE-IODINE 10 % EX SWAB
2.0000 | Freq: Once | CUTANEOUS | Status: AC
  Administered 2024-03-29: 2 via TOPICAL

## 2024-03-29 MED ORDER — ACETAMINOPHEN 500 MG PO TABS
1000.0000 mg | ORAL_TABLET | Freq: Four times a day (QID) | ORAL | Status: AC
  Administered 2024-03-29 – 2024-03-30 (×4): 1000 mg via ORAL
  Filled 2024-03-29 (×4): qty 2

## 2024-03-29 MED ORDER — FENTANYL CITRATE (PF) 100 MCG/2ML IJ SOLN
25.0000 ug | INTRAMUSCULAR | Status: DC | PRN
  Administered 2024-03-29: 50 ug via INTRAVENOUS
  Administered 2024-03-29: 25 ug via INTRAVENOUS
  Administered 2024-03-29: 50 ug via INTRAVENOUS
  Administered 2024-03-29: 25 ug via INTRAVENOUS

## 2024-03-29 MED ORDER — APIXABAN 2.5 MG PO TABS
2.5000 mg | ORAL_TABLET | Freq: Two times a day (BID) | ORAL | Status: DC
  Administered 2024-03-30: 2.5 mg via ORAL
  Filled 2024-03-29 (×2): qty 1

## 2024-03-29 MED ORDER — APIXABAN 2.5 MG PO TABS: ORAL_TABLET | ORAL | 0 refills | Status: DC

## 2024-03-29 MED ORDER — FENTANYL CITRATE (PF) 100 MCG/2ML IJ SOLN
INTRAMUSCULAR | Status: AC
  Filled 2024-03-29: qty 2

## 2024-03-29 MED ORDER — SODIUM CHLORIDE 0.9 % IV SOLN
INTRAVENOUS | Status: DC
Start: 2024-03-29 — End: 2024-03-30

## 2024-03-29 MED ORDER — ACETAMINOPHEN 10 MG/ML IV SOLN: 1000.0000 mg | Freq: Once | INTRAVENOUS | Status: DC | PRN

## 2024-03-29 MED ORDER — KETOROLAC TROMETHAMINE 15 MG/ML IJ SOLN
7.5000 mg | Freq: Four times a day (QID) | INTRAMUSCULAR | Status: AC
  Administered 2024-03-29 – 2024-03-30 (×4): 7.5 mg via INTRAVENOUS
  Filled 2024-03-29 (×4): qty 1

## 2024-03-29 MED ORDER — ONDANSETRON HCL 4 MG/2ML IJ SOLN
INTRAMUSCULAR | Status: DC | PRN
  Administered 2024-03-29: 4 mg via INTRAVENOUS

## 2024-03-29 MED ORDER — TRANEXAMIC ACID-NACL 1000-0.7 MG/100ML-% IV SOLN
1000.0000 mg | INTRAVENOUS | Status: AC
  Administered 2024-03-29: 1000 mg via INTRAVENOUS
  Filled 2024-03-29: qty 100

## 2024-03-29 MED ORDER — PROPOFOL 10 MG/ML IV BOLUS
INTRAVENOUS | Status: DC | PRN
Start: 2024-03-29 — End: 2024-03-29
  Administered 2024-03-29: 50 ug/kg/min via INTRAVENOUS

## 2024-03-29 MED ORDER — METHOCARBAMOL 1000 MG/10ML IJ SOLN: 500.0000 mg | Freq: Four times a day (QID) | INTRAMUSCULAR | Status: DC | PRN

## 2024-03-29 MED ORDER — DOCUSATE SODIUM 100 MG PO CAPS
100.0000 mg | ORAL_CAPSULE | Freq: Two times a day (BID) | ORAL | Status: DC
  Administered 2024-03-29 (×2): 100 mg via ORAL
  Filled 2024-03-29 (×2): qty 1

## 2024-03-29 MED ORDER — ONDANSETRON HCL 4 MG/2ML IJ SOLN
INTRAMUSCULAR | Status: AC
  Filled 2024-03-29: qty 2

## 2024-03-29 MED ORDER — FENTANYL CITRATE (PF) 50 MCG/ML IJ SOSY: 50.0000 ug | PREFILLED_SYRINGE | Freq: Once | INTRAMUSCULAR | Status: DC

## 2024-03-29 MED ORDER — MIDAZOLAM HCL (PF) 2 MG/2ML IJ SOLN
INTRAMUSCULAR | Status: DC | PRN
Start: 2024-03-29 — End: 2024-03-29
  Administered 2024-03-29: 2 mg via INTRAVENOUS

## 2024-03-29 MED ORDER — ONDANSETRON HCL 4 MG/2ML IJ SOLN: 4.0000 mg | Freq: Four times a day (QID) | INTRAMUSCULAR | Status: DC | PRN

## 2024-03-29 MED ORDER — ORAL CARE MOUTH RINSE: 15.0000 mL | Freq: Once | OROMUCOSAL | Status: AC

## 2024-03-29 MED ORDER — METOCLOPRAMIDE HCL 5 MG/ML IJ SOLN: 5.0000 mg | Freq: Three times a day (TID) | INTRAMUSCULAR | Status: DC | PRN

## 2024-03-29 MED ORDER — BUPIVACAINE IN DEXTROSE 0.75-8.25 % IT SOLN
INTRATHECAL | Status: DC | PRN
  Administered 2024-03-29: 1.6 mL via INTRATHECAL

## 2024-03-29 MED ORDER — ISOPROPYL ALCOHOL 70 % SOLN
Status: DC | PRN
  Administered 2024-03-29: 1 via TOPICAL

## 2024-03-29 MED ORDER — CEFAZOLIN SODIUM-DEXTROSE 2-4 GM/100ML-% IV SOLN
2.0000 g | INTRAVENOUS | Status: AC
  Administered 2024-03-29: 2 g via INTRAVENOUS
  Filled 2024-03-29: qty 100

## 2024-03-29 MED ORDER — PHENYLEPHRINE HCL-NACL 20-0.9 MG/250ML-% IV SOLN
INTRAVENOUS | Status: DC | PRN
  Administered 2024-03-29: 30 ug/min via INTRAVENOUS

## 2024-03-29 MED ORDER — TRANEXAMIC ACID-NACL 1000-0.7 MG/100ML-% IV SOLN
1000.0000 mg | Freq: Once | INTRAVENOUS | Status: AC
Start: 2024-03-29 — End: 2024-03-30
  Administered 2024-03-29: 1000 mg via INTRAVENOUS
  Filled 2024-03-29: qty 100

## 2024-03-29 MED ORDER — OXYCODONE HCL 5 MG PO TABS: 5.0000 mg | ORAL_TABLET | Freq: Once | ORAL | Status: DC | PRN

## 2024-03-29 MED ORDER — BUPIVACAINE-MELOXICAM ER 400-12 MG/14ML IJ SOLN
INTRAMUSCULAR | Status: AC
  Filled 2024-03-29: qty 1

## 2024-03-29 MED ORDER — ROPIVACAINE HCL 5 MG/ML IJ SOLN
INTRAMUSCULAR | Status: DC | PRN
  Administered 2024-03-29: 20 mL via PERINEURAL

## 2024-03-29 MED ORDER — PHENOL 1.4 % MT LIQD: 1.0000 | OROMUCOSAL | Status: DC | PRN

## 2024-03-29 MED ORDER — ACETAMINOPHEN 325 MG PO TABS: 325.0000 mg | ORAL_TABLET | Freq: Four times a day (QID) | ORAL | Status: DC | PRN

## 2024-03-29 MED ORDER — OXYCODONE HCL 5 MG PO TABS
10.0000 mg | ORAL_TABLET | Freq: Four times a day (QID) | ORAL | Status: DC | PRN
  Administered 2024-03-29: 10 mg via ORAL
  Filled 2024-03-29: qty 2

## 2024-03-29 SURGICAL SUPPLY — 65 items
ALCOHOL 70% 16 OZ (MISCELLANEOUS) ×1 IMPLANT
BAG COUNTER SPONGE SURGICOUNT (BAG) ×1 IMPLANT
BAG DECANTER FOR FLEXI CONT (MISCELLANEOUS) ×1 IMPLANT
BLADE SAG 18X100X1.27 (BLADE) ×1 IMPLANT
BLADE SAW SAG 90X13X1.27 (BLADE) ×1 IMPLANT
BLADE SAW SGTL 73X25 THK (BLADE) ×1 IMPLANT
BNDG COMPR ESMARK 6X3 LF (GAUZE/BANDAGES/DRESSINGS) IMPLANT
BOWL SMART MIX CTS (DISPOSABLE) IMPLANT
CEMENT BONE REFOBACIN R1X40 US (Cement) IMPLANT
CLSR STERI-STRIP ANTIMIC 1/2X4 (GAUZE/BANDAGES/DRESSINGS) IMPLANT
COOLER ICEMAN CLASSIC (MISCELLANEOUS) ×1 IMPLANT
COVER SURGICAL LIGHT HANDLE (MISCELLANEOUS) ×1 IMPLANT
CUFF TOURN SGL QUICK 42 (TOURNIQUET CUFF) IMPLANT
CUFF TRNQT CYL 34X4.125X (TOURNIQUET CUFF) ×1 IMPLANT
DERMABOND ADVANCED .7 DNX12 (GAUZE/BANDAGES/DRESSINGS) ×1 IMPLANT
DRAPE EXTREMITY T 121X128X90 (DISPOSABLE) ×1 IMPLANT
DRAPE HALF SHEET 40X57 (DRAPES) ×1 IMPLANT
DRAPE INCISE IOBAN 66X45 STRL (DRAPES) ×1 IMPLANT
DRAPE POUCH INSTRU U-SHP 10X18 (DRAPES) ×1 IMPLANT
DRAPE SURG ORHT 6 SPLT 77X108 (DRAPES) IMPLANT
DRAPE U-SHAPE 47X51 STRL (DRAPES) ×2 IMPLANT
DRSG AQUACEL AG ADV 3.5X10 (GAUZE/BANDAGES/DRESSINGS) ×1 IMPLANT
DURAPREP 26ML APPLICATOR (WOUND CARE) ×3 IMPLANT
ELECT CAUTERY BLADE 6.4 (BLADE) ×1 IMPLANT
ELECT PENCIL ROCKER SW 15FT (MISCELLANEOUS) ×1 IMPLANT
ELECTRODE REM PT RTRN 9FT ADLT (ELECTROSURGICAL) ×1 IMPLANT
FEMUR CEMT CR PERS STD SZ 5 RT (Knees) IMPLANT
GLOVE BIOGEL PI IND STRL 7.5 (GLOVE) ×1 IMPLANT
GLOVE ECLIPSE 7.0 STRL STRAW (GLOVE) ×5 IMPLANT
GLOVE INDICATOR 7.0 STRL GRN (GLOVE) ×1 IMPLANT
GLOVE SURG SYN 7.5 PF PI (GLOVE) ×5 IMPLANT
GOWN STRL REUS W/ TWL LRG LVL3 (GOWN DISPOSABLE) ×2 IMPLANT
GOWN TOGA ZIPPER T7+ PEEL AWAY (MISCELLANEOUS) ×1 IMPLANT
HOOD PEEL AWAY T7 (MISCELLANEOUS) ×1 IMPLANT
IMPL ASF RT PSN 4-5/CD 10 (Joint) IMPLANT
IV 0.9% NACL 1000 ML (IV SOLUTION) ×1 IMPLANT
KIT BASIN OR (CUSTOM PROCEDURE TRAY) ×1 IMPLANT
KIT TURNOVER KIT B (KITS) ×1 IMPLANT
MANIFOLD NEPTUNE II (INSTRUMENTS) ×1 IMPLANT
MARKER SKIN DUAL TIP RULER LAB (MISCELLANEOUS) ×2 IMPLANT
NDL SPNL 18GX3.5 QUINCKE PK (NEEDLE) ×1 IMPLANT
NEEDLE SPNL 18GX3.5 QUINCKE PK (NEEDLE) ×1 IMPLANT
PACK TOTAL JOINT (CUSTOM PROCEDURE TRAY) ×1 IMPLANT
PAD ARMBOARD POSITIONER FOAM (MISCELLANEOUS) ×2 IMPLANT
PAD COLD SHLDR WRAP-ON (PAD) ×1 IMPLANT
PIN DRILL HDLS TROCAR 75 4PK (PIN) IMPLANT
SCREW FEMALE HEX FIX 25X2.5 (ORTHOPEDIC DISPOSABLE SUPPLIES) IMPLANT
SET HNDPC FAN SPRY TIP SCT (DISPOSABLE) ×1 IMPLANT
SOLN 0.9% NACL POUR BTL 1000ML (IV SOLUTION) ×1 IMPLANT
SOLUTION PRONTOSAN WOUND 350ML (IRRIGATION / IRRIGATOR) ×1 IMPLANT
STAPLER SKIN PROX 35W (STAPLE) IMPLANT
STEM POLY PAT PLY 32M KNEE (Knees) IMPLANT
STEM TIBIA 5 DEG SZ C R KNEE (Knees) IMPLANT
SUCTION TUBE FRAZIER 10FR DISP (SUCTIONS) IMPLANT
SUT ETHILON 2 0 FS 18 (SUTURE) ×2 IMPLANT
SUT STRATAFIX PDS+ 0 24IN (SUTURE) ×1 IMPLANT
SUT VIC AB 0 CT1 27XBRD ANBCTR (SUTURE) ×1 IMPLANT
SUT VIC AB 1 CTX 27 (SUTURE) IMPLANT
SUT VIC AB 2-0 CT1 TAPERPNT 27 (SUTURE) ×3 IMPLANT
SYR 30ML LL (SYRINGE) ×2 IMPLANT
TOWEL GREEN STERILE (TOWEL DISPOSABLE) ×1 IMPLANT
TOWEL GREEN STERILE FF (TOWEL DISPOSABLE) ×1 IMPLANT
TUBE SUCT ARGYLE STRL (TUBING) ×1 IMPLANT
UNDERPAD 30X36 HEAVY ABSORB (UNDERPADS AND DIAPERS) ×1 IMPLANT
YANKAUER SUCT BULB TIP NO VENT (SUCTIONS) ×1 IMPLANT

## 2024-03-29 NOTE — H&P (Signed)
 PREOPERATIVE H&P  Chief Complaint: RIGHT KNEE OSTEOARTHRITIS  HPI: Erin Williamson is a 63 y.o. female who presents for surgical treatment of RIGHT KNEE OSTEOARTHRITIS.  She denies any changes in medical history.  Past Surgical History:  Procedure Laterality Date   CESAREAN SECTION     1987 and 1998   TOTAL KNEE ARTHROPLASTY Left 07/23/2022   Procedure: LEFT TOTAL KNEE ARTHROPLASTY;  Surgeon: Jerri Kay HERO, MD;  Location: [facility];  Service: Orthopedics;  Laterality: Left;   tummy tuck  2000   Social History   Socioeconomic History   Marital status: Divorced    Spouse name: Not on file   Number of children: 2   Years of education: Not on file   Highest education level: Not on file  Occupational History   Not on file  Tobacco Use   Smoking status: Former    Current packs/day: 0.00    Types: Cigarettes    Quit date: 05/27/1988    Years since quitting: 35.8   Smokeless tobacco: Never  Vaping Use   Vaping status: Never Used  Substance and Sexual Activity   Alcohol use: Yes    Comment: maybe 1 drink once a month   Drug use: Never   Sexual activity: Not Currently    Birth control/protection: None  Other Topics Concern   Not on file  Social History Narrative   Not on file   Social Drivers of Health   Financial Resource Strain: Not on file  Food Insecurity: Not on file  Transportation Needs: Not on file  Physical Activity: Not on file  Stress: Not on file  Social Connections: Not on file   Family History  Problem Relation Age of Onset   COPD Mother    Heart attack Mother    Heart disease Mother    Hyperlipidemia Mother    Hypertension Mother    Early death Father    Stroke Sister    Arthritis Sister    Colon cancer Sister    Mental illness Son    Rectal cancer Neg Hx    Stomach cancer Neg Hx    Esophageal cancer Neg Hx    Liver cancer Neg Hx    Pancreatic cancer Neg Hx    Allergies  Allergen Reactions   Ofloxacin Hives, Itching and Rash   Prior to  Admission medications   Medication Sig Start Date End Date Taking? Authorizing Provider  docusate sodium  (COLACE) 100 MG capsule Take 1 capsule (100 mg total) by mouth daily as needed. 03/19/24 03/19/25  Jule Ronal CROME, PA-C  ibuprofen (ADVIL) 200 MG tablet Take 400 mg by mouth every 8 (eight) hours as needed (pain.).   Yes [provider]  MAGNESIUM PO Take 1 tablet by mouth at bedtime.   Yes [provider]  meloxicam  (MOBIC ) 7.5 MG tablet TAKE 1 TABLET (7.5 MG TOTAL) BY MOUTH DAILY AS NEEDED FOR UP TO 30 DOSES FOR PAIN. 02/16/24  Yes Jule Ronal CROME, PA-C  methocarbamol  (ROBAXIN ) 750 MG tablet Take 1 tablet (750 mg total) by mouth 3 (three) times daily as needed. 03/19/24   Jule Ronal CROME, PA-C  ondansetron  (ZOFRAN ) 4 MG tablet Take 1 tablet (4 mg total) by mouth every 8 (eight) hours as needed for nausea or vomiting. 03/19/24   Jule Ronal CROME, PA-C  oxyCODONE -acetaminophen  (PERCOCET) 5-325 MG tablet Take 1-2 tablets by mouth every 6 (six) hours as needed. To be taken after surgery 03/19/24   Jule Ronal CROME, PA-C  Polyethyl Glycol-Propyl Glycol (SYSTANE) 0.4-0.3 % SOLN Place 1-2 drops into both eyes 3 (three) times daily as needed (dry/irritated eyes.).   Yes [provider]  tirzepatide (ZEPBOUND) 15 MG/0.5ML Pen Inject 10 mg into the skin See admin instructions. Inject 15 mg subcutaneously every 10 days   Yes [provider]  LORazepam  (ATIVAN ) 1 MG tablet Take 0.5 tablets (0.5 mg total) by mouth 3 (three) times daily as needed for anxiety (panic attacks). 08/11/23   Mahlon Comer BRAVO, MD     Positive ROS: All other systems have been reviewed and were otherwise negative with the exception of those mentioned in the HPI and as above.  Physical Exam: General: Alert, no acute distress Cardiovascular: No pedal edema Respiratory: No cyanosis, no use of accessory musculature GI: abdomen soft Skin: No lesions in the area of chief complaint Neurologic:  Sensation intact distally Psychiatric: Patient is competent for consent with normal mood and affect Lymphatic: no lymphedema  MUSCULOSKELETAL: exam stable  Assessment: RIGHT KNEE OSTEOARTHRITIS  Plan: Plan for Procedure(s): ARTHROPLASTY, KNEE, TOTAL  The risks benefits and alternatives were discussed with the patient including but not limited to the risks of nonoperative treatment, versus surgical intervention including infection, bleeding, nerve injury,  blood clots, cardiopulmonary complications, morbidity, mortality, among others, and they were willing to proceed.   Ozell Cummins, MD 03/29/2024 8:32 AM

## 2024-03-29 NOTE — Progress Notes (Signed)
 Orthopedic Tech Progress Note Patient Details:  Erin Williamson [birth year] [id number]  Ortho Devices Type of Ortho Device: Bone foam zero knee Ortho Device/Splint Location: RLE Ortho Device/Splint Interventions: Ordered, Application   Post Interventions Patient Tolerated: Well  [name] [name] 03/29/2024, 3:02 PM

## 2024-03-29 NOTE — Transfer of Care (Signed)
 Immediate Anesthesia Transfer of Care Note  Patient: Erin Williamson  Procedure(s) Performed: ARTHROPLASTY, KNEE, TOTAL (Right: Knee)  Patient Location: PACU  Anesthesia Type:MAC and Spinal  Level of Consciousness: awake and drowsy  Airway & Oxygen Therapy: Patient Spontanous Breathing  Post-op Assessment: Report given to RN  Post vital signs: Reviewed and stable  Last Vitals:  Vitals Value Taken Time  BP 118/70 03/29/24 12:02  Temp 34.6 C 03/29/24 12:02  Pulse 59 03/29/24 12:09  Resp 12 03/29/24 12:09  SpO2 99 % 03/29/24 12:09  Vitals shown include unfiled device data.  Last Pain:  Vitals:   03/29/24 0843  TempSrc:   PainSc: 0-No pain         Complications: No notable events documented.

## 2024-03-29 NOTE — Op Note (Signed)
 Total Knee Arthroplasty Procedure Note  Preoperative diagnosis: Right knee osteoarthritis  Postoperative diagnosis:same  Operative findings: Complete loss articular cartilage from all joint surfaces osteopenia  Operative procedure: Right total knee arthroplasty. CPT [phone]  Surgeon: N. Ozell Cummins, MD  Assist: Ronal Morna Grave, PA-C; necessary for the timely completion of procedure and due to complexity of procedure.  Anesthesia: Spinal, regional, local  Tourniquet time: see anesthesia record  Implants used: Zimmer persona Femur: CR 5 nickel free Tibia: C Patella: 32 mm Polyethylene: 10 mm medial congruent  Indication: Erin Williamson is a 63 y.o. year old female with a history of knee pain. Having failed conservative management, the patient elected to proceed with a total knee arthroplasty.  We have reviewed the risk and benefits of the surgery and they elected to proceed after voicing understanding.  Procedure:  After informed consent was obtained and understanding of the risk were voiced including but not limited to bleeding, infection, damage to surrounding structures including nerves and vessels, blood clots, leg length inequality and the failure to achieve desired results, the operative extremity was marked with verbal confirmation of the patient in the holding area.   The patient was then brought to the operating room and transported to the operating room table in the supine position.  A tourniquet was applied to the operative extremity around the upper thigh. The operative limb was then prepped and draped in the usual sterile fashion and preoperative antibiotics were administered.  A time out was performed prior to the start of surgery confirming the correct extremity, preoperative antibiotic administration, as well as team members, implants and instruments available for the case. Correct surgical site was also confirmed with preoperative radiographs. The limb was then  elevated for exsanguination and the tourniquet was inflated. A midline incision was made and a standard medial parapatellar approach was performed.  The infrapatellar fat pad was removed.  Suprapatellar synovium was removed to reveal the anterior distal femoral cortex.  A medial peel was performed to release the capsule and the deep MCL off of the medial tibial plateau back to the semimembranosus.  The patella was then everted which showed complete loss of articular cartilage and was resected down to 12 mm and sized to a 32 mm.  A cover was placed on the patella for protection from retractors.  The knee was then brought into flexion and we then turned our attention to the femur.  The ACL was sacrificed.  Start site was drilled in the femur and the intramedullary distal femoral cutting guide was placed, set at 5 degrees valgus, taking 10 mm of distal resection. The distal cut was made. Osteophytes were then removed.  Next, the proximal tibial cutting guide was placed with appropriate slope, varus/valgus alignment and depth of resection.  The drop rod was attached to confirm that it was aimed at the second metatarsal.  The proximal tibial cut was made taking 4 mm off the low side. Gap blocks were then used to assess the extension gap and alignment, and appropriate soft tissue releases were performed. Attention was turned back to the femur, which was sized using the sizing guide to a size 5. Appropriate rotation of the femoral component was determined using epicondylar axis, Whiteside's line, and assessing the flexion gap under ligament tension. The appropriate size 4-in-1 cutting block was placed and checked with an angel wing and cuts were made. Posterior femoral osteophytes and uncapped bone were then removed with the curved osteotome.  The menisci were removed.  Trial components were placed, and stability was checked in full extension, mid-flexion, and deep flexion.  PCL was retained. Proper tibial rotation was  determined and marked.  The patella tracked well without a lateral release.  The femoral lugs were then drilled. Trial components were then removed and tibial preparation performed.  The trial tibia was pointed to the medial third of the tibial tubercle.  The tibia was sized for a size C component and prepared.  Trial components were removed.    The bony surfaces were irrigated with a pulse lavage and then dried. Bone cement was vacuum mixed on the back table, and the final components sized above were cemented into place.  Antibiotic irrigation was placed in the knee joint and soft tissues while the cement cured.  After cement had finished curing, excess cement was removed. The stability of the construct was re-evaluated throughout a range of motion and found to be acceptable. The trial liner was removed, the knee was copiously irrigated, and the knee was re-evaluated for any excess bone debris. The real polyethylene liner, 10 mm thick, was inserted and checked to ensure the locking mechanism had engaged appropriately. The tourniquet was deflated and hemostasis was achieved. The wound was irrigated with normal saline.  One gram of vancomycin  powder was placed in the surgical bed.  Topical mixture of 0.25% bupivacaine  and meloxicam  was placed in the joint for postoperative pain.  Capsular closure was performed with a #1 stratafix in flexion, subcutaneous fat closed with a 0 vicryl suture, then subcutaneous tissue closed with interrupted 2.0 vicryl suture. The skin was then closed with a 2.0 nylon and dermabond. A sterile dressing was applied.  The patient was awakened in the operating room and taken to recovery in stable condition. All sponge, needle, and instrument counts were correct at the end of the case.  Morna Grave was necessary for opening, closing, retracting, limb positioning and overall facilitation and completion of the surgery.  Position: supine  Complications: none.  Time Out: performed    Drains/Packing: none Estimated blood loss: minimal Returned to Recovery Room: in good condition.   Mechanical VTE (DVT) Prophylaxis: sequential compression devices, TED thigh-high  Chemical VTE (DVT) Prophylaxis: aspirin  POD 0  Fluid Replacement  Crystalloid: see anesthesia record Blood: none  FFP: none   Specimens Removed: 1 to pathology  Sponge and Instrument Count Correct? yes  PACU: portable radiograph - knee AP and Lateral  Plan/RTC: Return in 2 weeks for suture removal.  Weight Bearing/Load Lower Extremity: full   Implant Name Type Inv. Item Serial No. Manufacturer Lot No. LRB No. Used Action  CEMENT BONE REFOBACIN [id number] US  - [id number] Cement CEMENT BONE REFOBACIN [id number] US   [employer]MER [employer]ON(ORTH,TRAU,BIO,SG) [id number] Right 1 Implanted  CEMENT BONE REFOBACIN [id number] US  - [id number] Cement CEMENT BONE REFOBACIN [id number] US   [employer]IMMER [employer]ON(ORTH,TRAU,BIO,SG) [id number] Right 1 Implanted  STEM POLY PAT PLY 31M KNEE - [id number] Knees STEM POLY PAT PLY 31M KNEE  [employer]ON(ORTH,TRAU,BIO,SG) [id number] Right 1 Implanted  IMPL ASF RT PSN 4-5/CD 10 - [id number] Joint IMPL ASF RT PSN 4-5/CD 10  [employer]ON(ORTH,TRAU,BIO,SG) [id number] Right 1 Implanted  STEM TIBIA 5 DEG SZ C R KNEE - [id number] Knees STEM TIBIA 5 DEG SZ C R KNEE  [employer]MER [employer]ON(ORTH,TRAU,BIO,SG) [id number] Right 1 Implanted  FEMUR CEMT CR PERS STD SZ 5 RT - [id number] Knees FEMUR CEMT CR PERS STD SZ 5 RT  [employer]MER [employer]ON(ORTH,TRAU,BIO,SG) [id number] Right 1 Implanted    N. Ozell Cummins, MD [facility] 11:30 AM

## 2024-03-29 NOTE — Anesthesia Procedure Notes (Signed)
 Anesthesia Regional Block: Adductor canal block   Pre-Anesthetic Checklist: , timeout performed,  Correct Patient, Correct Site, Correct Laterality,  Correct Procedure, Correct Position, site marked,  Risks and benefits discussed,  Surgical consent,  Pre-op evaluation,  At surgeon's request and post-op pain management  Laterality: Right  Prep: chloraprep       Needles:  Injection technique: Single-shot  Needle Type: Echogenic Stimulator Needle     Needle Length: 9cm  Needle Gauge: 21     Additional Needles:   Procedures:,,,, ultrasound used (permanent image in chart),,    Narrative:  Start time: 03/29/2024 9:20 AM End time: 03/29/2024 9:25 AM Injection made incrementally with aspirations every 5 mL.  Performed by: Personally  Anesthesiologist: Erma Thom SAUNDERS, MD  Additional Notes: Discussed risks and benefits of the nerve block in detail, including but not limited vascular injury, permanent nerve damage and infection.   Patient tolerated the procedure well. Local anesthetic introduced in an incremental fashion under minimal resistance after negative aspirations. No paresthesias were elicited. After completion of the procedure, no acute issues were identified and patient continued to be monitored by RN.

## 2024-03-29 NOTE — Anesthesia Procedure Notes (Signed)
 Spinal  Patient location during procedure: OR Start time: 03/29/2024 10:12 AM End time: 03/29/2024 10:16 AM Reason for block: surgical anesthesia Staffing Performed: anesthesiologist  Anesthesiologist: Erma Thom SAUNDERS, MD Performed by: Erma Thom SAUNDERS, MD Authorized by: Erma Thom SAUNDERS, MD   Preanesthetic Checklist Completed: patient identified, IV checked, site marked, risks and benefits discussed, surgical consent, monitors and equipment checked, pre-op evaluation and timeout performed Spinal Block Patient position: sitting Prep: DuraPrep Patient monitoring: heart rate, cardiac monitor, continuous pulse ox and blood pressure Approach: midline Location: L4-5 Needle Needle type: Pencan  Needle gauge: 24 G Assessment Sensory level: T6 Additional Notes Functioning IV was confirmed and monitors were applied. Sterile prep and drape, including hand hygiene and sterile gloves were used. The patient was positioned and the spine was prepped. The skin was anesthetized with lidocaine .  Free flow of clear CSF was obtained prior to injecting local anesthetic into the CSF.  The spinal needle aspirated freely following injection.  The needle was carefully withdrawn.  The patient tolerated the procedure well.

## 2024-03-29 NOTE — Discharge Instructions (Addendum)

## 2024-03-29 NOTE — Evaluation (Signed)
 Physical Therapy Evaluation Patient Details Name: Erin Williamson MRN: [id number] DOB: 01/29/1961 Today's Date: 03/29/2024  History of Present Illness  63 yo female s/p R TKR 11/3. 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  Clinical Impression  Pt presents with R knee pain post-op, decreased strength and ROM RLE post-op, increased time and effort to mobilize, and decreased activity tolerance. Pt to benefit from acute PT to address deficits. Pt ambulated short hallway distance with use of RW and close guard for safety, cues for form and safety provided. Pt educated on ankle pumps (20/hour) to perform this afternoon/evening to increase circulation, to pt's tolerance and limited by pain. PT to progress mobility as tolerated, and will continue to follow acutely.            If plan is discharge home, recommend the following: A little help with bathing/dressing/bathroom;A little help with walking and/or transfers   Can travel by private vehicle        Equipment Recommendations None recommended by PT  Recommendations for Other Services       Functional Status Assessment Patient has had a recent decline in their functional status and demonstrates the ability to make significant improvements in function in a reasonable and predictable amount of time.     Precautions / Restrictions Precautions Precautions: Fall Restrictions Weight Bearing Restrictions Per Provider Order: No RLE Weight Bearing Per Provider Order: Weight bearing as tolerated      Mobility  Bed Mobility Overal bed mobility: Needs Assistance Bed Mobility: Supine to Sit     Supine to sit: Supervision     General bed mobility comments: for safety, increased time    Transfers Overall transfer level: Needs assistance Equipment used: Rolling walker (2 wheels) Transfers: Sit to/from Stand Sit to Stand: Contact guard assist           General transfer comment: slow to rise and steady, no physical assist. cues for  keeping RLE out in knee extension when moving sit<>stand to prevent excessive painful knee flexion    Ambulation/Gait Ambulation/Gait assistance: Contact guard assist Gait Distance (Feet): 90 Feet Assistive device: Rolling walker (2 wheels) Gait Pattern/deviations: Step-through pattern, Decreased stride length, Trunk flexed Gait velocity: decr     General Gait Details: for safety, cues for upright posture, sequencing, WBAT  Stairs            Wheelchair Mobility     Tilt Bed    Modified Rankin (Stroke Patients Only)       Balance Overall balance assessment: Mild deficits observed, not formally tested                                           Pertinent Vitals/Pain Pain Assessment Pain Assessment: Faces Faces Pain Scale: Hurts little more Pain Location: R knee Pain Descriptors / Indicators: Sore, Discomfort, Grimacing, Guarding Pain Intervention(s): Monitored during session, Limited activity within patient's tolerance, Repositioned    Home Living Family/patient expects to be discharged to:: Private residence Living Arrangements: Alone Available Help at Discharge: Family;Available 24 hours/day Type of Home: House Home Access: Level entry       Home Layout: One level Home Equipment: Agricultural Consultant (2 wheels);Cane - single point;Shower seat      Prior Function Prior Level of Function : Independent/Modified Independent             Mobility Comments: using cane PTA  Extremity/Trunk Assessment   Upper Extremity Assessment Upper Extremity Assessment: Defer to OT evaluation    Lower Extremity Assessment Lower Extremity Assessment: RLE deficits/detail RLE Deficits / Details: anticipated post-op weakness; able to perform ankle pumps, quad set, knee extension lacking 5 deg, knee flexion to 80 deg    Cervical / Trunk Assessment Cervical / Trunk Assessment: Normal  Communication   Communication Communication: No apparent  difficulties    Cognition Arousal: Alert Behavior During Therapy: WFL for tasks assessed/performed   PT - Cognitive impairments: No apparent impairments                         Following commands: Intact       Cueing Cueing Techniques: Verbal cues, Gestural cues     General Comments      Exercises     Assessment/Plan    PT Assessment Patient needs continued PT services  PT Problem List Decreased mobility;Decreased strength;Decreased activity tolerance;Decreased balance;Decreased knowledge of use of DME;Pain;Decreased knowledge of precautions;Decreased range of motion       PT Treatment Interventions DME instruction;Therapeutic activities;Gait training;Therapeutic exercise;Patient/family education;Stair training;Balance training;Functional mobility training;Neuromuscular re-education    PT Goals (Current goals can be found in the Care Plan section)  Acute Rehab PT Goals Patient Stated Goal: home tomorrow PT Goal Formulation: With patient Time For Goal Achievement: 04/12/24 Potential to Achieve Goals: Good    Frequency 7X/week     Co-evaluation               AM-PAC PT 6 Clicks Mobility  Outcome Measure Help needed turning from your back to your side while in a flat bed without using bedrails?: A Little Help needed moving from lying on your back to sitting on the side of a flat bed without using bedrails?: A Little Help needed moving to and from a bed to a chair (including a wheelchair)?: A Little Help needed standing up from a chair using your arms (e.g., wheelchair or bedside chair)?: A Little Help needed to walk in hospital room?: A Little Help needed climbing 3-5 steps with a railing? : A Little 6 Click Score: 18    End of Session   Activity Tolerance: Patient tolerated treatment well Patient left: in chair;with call bell/phone within reach;Other (comment) (3C does not use chair alarms, pt states she will press call button and wait for assist  prior to mobilizing) Nurse Communication: Mobility status PT Visit Diagnosis: Other abnormalities of gait and mobility (R26.89);Muscle weakness (generalized) (M62.81)    Time: 8369-8341 PT Time Calculation (min) (ACUTE ONLY): 28 min   Charges:   PT Evaluation $PT Eval Low Complexity: 1 Low PT Treatments $Gait Training: 8-22 mins PT General Charges $$ ACUTE PT VISIT: 1 Visit         Johana RAMAN, PT DPT Acute Rehabilitation Services Secure Chat Preferred  Office [phone]   Carolyn Maniscalco E Johna 03/29/2024, 5:12 PM

## 2024-03-29 NOTE — Anesthesia Postprocedure Evaluation (Signed)
 Anesthesia Post Note  Patient: Oleta Speller  Procedure(s) Performed: ARTHROPLASTY, KNEE, TOTAL (Right: Knee)     Patient location during evaluation: PACU Anesthesia Type: Regional and Spinal Level of consciousness: awake and alert Pain management: pain level controlled Vital Signs Assessment: post-procedure vital signs reviewed and stable Respiratory status: spontaneous breathing, nonlabored ventilation, respiratory function stable and patient connected to nasal cannula oxygen Cardiovascular status: blood pressure returned to baseline and stable Postop Assessment: no apparent nausea or vomiting Anesthetic complications: no   No notable events documented.  Last Vitals:  Vitals:   03/29/24 1427 03/29/24 1628  BP: (!) 162/75 (!) 172/77  Pulse: (!) 58 64  Resp: 19 19  Temp: 37.1 C 36.9 C  SpO2: 100% 100%    Last Pain:  Vitals:   03/29/24 1628  TempSrc: Oral  PainSc:                  Thom JONELLE Peoples

## 2024-03-30 ENCOUNTER — Other Ambulatory Visit: Payer: Self-pay | Admitting: Physician Assistant

## 2024-03-30 ENCOUNTER — Other Ambulatory Visit (HOSPITAL_COMMUNITY): Payer: Self-pay

## 2024-03-30 ENCOUNTER — Encounter (HOSPITAL_COMMUNITY): Payer: Self-pay | Admitting: Orthopaedic Surgery

## 2024-03-30 DIAGNOSIS — M1711 Unilateral primary osteoarthritis, right knee: Secondary | ICD-10-CM | POA: Diagnosis not present

## 2024-03-30 MED ORDER — PROPOFOL 1000 MG/100ML IV EMUL
INTRAVENOUS | Status: AC
  Filled 2024-03-30: qty 300

## 2024-03-30 MED ORDER — APIXABAN 2.5 MG PO TABS
ORAL_TABLET | ORAL | 0 refills | Status: DC
  Filled 2024-03-30: qty 60, 30d supply, fill #0

## 2024-03-30 NOTE — Progress Notes (Signed)
 Patient alert and oriented, voided, ambulate. Surgical site clean and dry no sign of infection. D/c instructions explain and given to the patient, all questions answered.

## 2024-03-30 NOTE — Evaluation (Signed)
 Occupational Therapy Evaluation Patient Details Name: Erin Williamson MRN: [id number] DOB: 1960-06-25 Today's Date: 03/30/2024   History of Present Illness   63 yo female s/p R TKR 11/3. 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     Clinical Impressions Patient admitted for above and presents at modified independent to independent level for ADLs, functional transfers and mobility.  Pt using RW and educated on compensatory techniques/ safety with ADLs.  Pt will have support of daughter at dc for 3 weeks.  No further OT needs identified and OT will sign off.       If plan is discharge home, recommend the following:   Assist for transportation;Assistance with cooking/housework     Functional Status Assessment         Equipment Recommendations   None recommended by OT     Recommendations for Other Services         Precautions/Restrictions   Precautions Precautions: Fall Restrictions Weight Bearing Restrictions Per Provider Order: Yes RLE Weight Bearing Per Provider Order: Weight bearing as tolerated     Mobility Bed Mobility Overal bed mobility: Independent                  Transfers Overall transfer level: Modified independent Equipment used: Rolling walker (2 wheels)                      Balance Overall balance assessment: Mild deficits observed, not formally tested                                         ADL either performed or assessed with clinical judgement   ADL Overall ADL's : Modified independent                                       General ADL Comments: cueing for safety to complete ADLs sitting     Vision   Vision Assessment?: No apparent visual deficits     Perception         Praxis         Pertinent Vitals/Pain Pain Assessment Pain Assessment: Faces Faces Pain Scale: Hurts little more Pain Location: R knee Pain Descriptors / Indicators: Sore, Discomfort, Grimacing,  Guarding Pain Intervention(s): Limited activity within patient's tolerance, Monitored during session, Repositioned     Extremity/Trunk Assessment Upper Extremity Assessment Upper Extremity Assessment: Overall WFL for tasks assessed   Lower Extremity Assessment Lower Extremity Assessment: Defer to PT evaluation (s/p R TKA)   Cervical / Trunk Assessment Cervical / Trunk Assessment: Normal   Communication Communication Communication: No apparent difficulties   Cognition Arousal: Alert Behavior During Therapy: WFL for tasks assessed/performed Cognition: No apparent impairments                               Following commands: Intact       Cueing  General Comments   Cueing Techniques: Verbal cues;Gestural cues      Exercises     Shoulder Instructions      Home Living Family/patient expects to be discharged to:: Private residence Living Arrangements: Alone Available Help at Discharge: Family;Available 24 hours/day Type of Home: House Home Access: Level entry     Home Layout: One level  Bathroom Shower/Tub: Producer, Television/film/video: Standard     Home Equipment: Agricultural Consultant (2 wheels);Cane - single point;Shower seat   Additional Comments: daughter helping for 3 weeks      Prior Functioning/Environment Prior Level of Function : Independent/Modified Independent             Mobility Comments: using cane PTA ADLs Comments: ind and driving    OT Problem List: Decreased activity tolerance;Pain   OT Treatment/Interventions:        OT Goals(Current goals can be found in the care plan section)   Acute Rehab OT Goals Patient Stated Goal: home OT Goal Formulation: With patient   OT Frequency:       Co-evaluation              AM-PAC OT 6 Clicks Daily Activity     Outcome Measure Help from another person eating meals?: None Help from another person taking care of personal grooming?: None Help from another person  toileting, which includes using toliet, bedpan, or urinal?: None Help from another person bathing (including washing, rinsing, drying)?: None Help from another person to put on and taking off regular upper body clothing?: None Help from another person to put on and taking off regular lower body clothing?: None 6 Click Score: 24   End of Session Equipment Utilized During Treatment: Rolling walker (2 wheels) Nurse Communication: Mobility status;Precautions  Activity Tolerance: Patient tolerated treatment well Patient left: in bed;with call bell/phone within reach;with bed alarm set;with family/visitor present  OT Visit Diagnosis: Pain Pain - Right/Left: Right Pain - part of body: Knee                Time: 9154-9092 OT Time Calculation (min): 22 min Charges:  OT General Charges $OT Visit: 1 Visit OT Evaluation $OT Eval Low Complexity: 1 Low  Etta NOVAK, OT Acute Rehabilitation Services Office [phone] Secure Chat Preferred    Etta GORMAN Hope 03/30/2024, 10:15 AM

## 2024-03-30 NOTE — Progress Notes (Signed)
 Physical Therapy Treatment  Patient Details Name: Erin Williamson MRN: [id number] DOB: 29-Aug-1960 Today's Date: 03/30/2024   History of Present Illness Pt is a 63 y/o female s/p R TKR 11/3. 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    PT Comments  Pt progressing well with post-op mobility. Pt completed functional mobility training and stair training. Able to complete grossly mod I with close guard on the stairs for safety. RW for support. Reviewed HEP and car transfer. Pt has met acute PT goals and is d/c from acute PT services at this time. Will sign off. If needs change, please reconsult.    If plan is discharge home, recommend the following: A little help with bathing/dressing/bathroom;A little help with walking and/or transfers   Can travel by private vehicle        Equipment Recommendations  None recommended by PT    Recommendations for Other Services       Precautions / Restrictions Precautions Precautions: Fall Restrictions Weight Bearing Restrictions Per Provider Order: Yes RLE Weight Bearing Per Provider Order: Weight bearing as tolerated     Mobility  Bed Mobility Overal bed mobility: Modified Independent Bed Mobility: Supine to Sit           General bed mobility comments: No assist required    Transfers Overall transfer level: Modified independent Equipment used: Rolling walker (2 wheels) Transfers: Sit to/from Stand             General transfer comment: No assist. Pt demonstrated proper hand placement on seated surface for safety.    Ambulation/Gait Ambulation/Gait assistance: Modified independent (Device/Increase time) Gait Distance (Feet): 350 Feet Assistive device: Rolling walker (2 wheels) Gait Pattern/deviations: Step-through pattern, Decreased stride length, Trunk flexed, Decreased weight shift to right, Decreased dorsiflexion - right Gait velocity: Decreased Gait velocity interpretation: 1.31 - 2.62 ft/sec, indicative of limited community  ambulator   General Gait Details: VC's for increased heel strike and smooth step-through gait pattern   Stairs Stairs: Yes Stairs assistance: Contact guard assist Stair Management: One rail Right, Step to pattern, Forwards Number of Stairs: 5 General stair comments: VC's for sequencing and general safety.   Wheelchair Mobility     Tilt Bed    Modified Rankin (Stroke Patients Only)       Balance Overall balance assessment: Mild deficits observed, not formally tested                                          Communication Communication Communication: No apparent difficulties  Cognition Arousal: Alert Behavior During Therapy: WFL for tasks assessed/performed   PT - Cognitive impairments: No apparent impairments                         Following commands: Intact      Cueing Cueing Techniques: Verbal cues, Gestural cues  Exercises Total Joint Exercises Ankle Circles/Pumps: 10 reps Quad Sets: 10 reps Towel Squeeze: 10 reps Short Arc Quad: 10 reps Heel Slides: 10 reps Hip ABduction/ADduction: 10 reps Straight Leg Raises: 10 reps Long Arc Quad: 10 reps Knee Flexion: 10 reps Goniometric ROM: 5-100 AROM    General Comments        Pertinent Vitals/Pain Pain Assessment Pain Assessment: Faces Faces Pain Scale: Hurts little more Pain Location: R knee Pain Descriptors / Indicators: Sore, Discomfort, Grimacing, Guarding Pain Intervention(s): Limited activity within patient's  tolerance, Monitored during session, Repositioned    Home Living Family/patient expects to be discharged to:: Private residence Living Arrangements: Alone Available Help at Discharge: Family;Available 24 hours/day Type of Home: House Home Access: Level entry       Home Layout: One level Home Equipment: Agricultural Consultant (2 wheels);Cane - single point;Shower seat Additional Comments: daughter helping for 3 weeks    Prior Function            PT Goals  (current goals can now be found in the care plan section) Acute Rehab PT Goals Patient Stated Goal: home tomorrow PT Goal Formulation: With patient Time For Goal Achievement: 04/12/24 Potential to Achieve Goals: Good Progress towards PT goals: Goals met/education completed, patient discharged from PT    Frequency    7X/week      PT Plan      Co-evaluation              AM-PAC PT 6 Clicks Mobility   Outcome Measure  Help needed turning from your back to your side while in a flat bed without using bedrails?: None Help needed moving from lying on your back to sitting on the side of a flat bed without using bedrails?: None Help needed moving to and from a bed to a chair (including a wheelchair)?: None Help needed standing up from a chair using your arms (e.g., wheelchair or bedside chair)?: None Help needed to walk in hospital room?: None Help needed climbing 3-5 steps with a railing? : A Little 6 Click Score: 23    End of Session Equipment Utilized During Treatment: Gait belt Activity Tolerance: Patient tolerated treatment well Patient left: in bed;with call bell/phone within reach Nurse Communication: Mobility status PT Visit Diagnosis: Other abnormalities of gait and mobility (R26.89);Muscle weakness (generalized) (M62.81)     Time: 9059-8985 PT Time Calculation (min) (ACUTE ONLY): 34 min  Charges:    $Gait Training: 8-22 mins $Therapeutic Exercise: 8-22 mins PT General Charges $$ ACUTE PT VISIT: 1 Visit                     Erin Williamson, PT, DPT Acute Rehabilitation Services Secure Chat Preferred Office: [phone]    Erin Williamson 03/30/2024, 10:21 AM

## 2024-03-30 NOTE — Discharge Summary (Signed)
 Patient ID: Erin Williamson MRN: [id number] DOB/AGE: 09-06-1960 63 y.o.  Admit date: 03/29/2024 Discharge date: 03/30/2024  Admission Diagnoses:  Principal Problem:   Primary osteoarthritis of left knee Active Problems:   Status post total right knee replacement   Discharge Diagnoses:  Same  Past Medical History:  Diagnosis Date   Anxiety    Arthritis    GERD (gastroesophageal reflux disease)    Hyperlipidemia    Hypertension    Migraine    Osteoarthritis    left and right knee   PONV (postoperative nausea and vomiting)     Surgeries: Procedure(s): ARTHROPLASTY, KNEE, TOTAL on 03/29/2024   Consultants:   Discharged Condition: Improved  Hospital Course: Erin Williamson is an 63 y.o. female who was admitted 03/29/2024 for operative treatment ofPrimary osteoarthritis of left knee. Patient has severe unremitting pain that affects sleep, daily activities, and work/hobbies. After pre-op clearance the patient was taken to the operating room on 03/29/2024 and underwent  Procedure(s): ARTHROPLASTY, KNEE, TOTAL.    Patient was given perioperative antibiotics:  Anti-infectives (From admission, onward)    Start     Dose/Rate Route Frequency Ordered Stop   03/29/24 1315  ceFAZolin  (ANCEF ) IVPB 2g/100 mL premix        2 g 200 mL/hr over 30 Minutes Intravenous Every 6 hours 03/29/24 1305 03/30/24 0807   03/29/24 1047  vancomycin  (VANCOCIN ) powder  Status:  Discontinued          As needed 03/29/24 1047 03/29/24 1200   03/29/24 0745  ceFAZolin  (ANCEF ) IVPB 2g/100 mL premix        2 g 200 mL/hr over 30 Minutes Intravenous On call to O.R. 03/29/24 0744 03/29/24 1016        Patient was given sequential compression devices, early ambulation, and chemoprophylaxis to prevent DVT.  Inpatient Morphine Milligram Equivalents Per Day 11/3 - 11/4   Values displayed are in units of MME/Day    Order Start / End Date Yesterday Today    oxyCODONE  (Oxy IR/ROXICODONE ) immediate release tablet 5 mg  11/3 - 11/3 0 of Unknown --    oxyCODONE  (ROXICODONE ) 5 MG/5ML solution 5 mg 11/3 - 11/3 0 of Unknown --      Group total: 0 of Unknown     oxyCODONE  (Oxy IR/ROXICODONE ) immediate release tablet 5 mg 11/3 - No end date 0 of 15 7.5 of 30    oxyCODONE  (Oxy IR/ROXICODONE ) immediate release tablet 10 mg 11/3 - No end date 15 of 30 0 of 60    HYDROmorphone  (DILAUDID ) injection 1 mg 11/3 - No end date 0 of 40 0 of 60    fentaNYL  (SUBLIMAZE ) injection 25-50 mcg 11/3 - 11/3 45 of 45-90 --    fentaNYL  (SUBLIMAZE ) 100 MCG/2ML injection 11/3 - 11/3 Unknown of Unknown --    fentaNYL  (SUBLIMAZE ) injection 50 mcg 11/3 - 11/3 0 of 15 --    Daily Totals  Unknown (at least 60) of Unknown (at least 145-190) 7.5 of 150    Calculation Errors     Order Type Date Details   oxyCODONE  (Oxy IR/ROXICODONE ) immediate release tablet 5 mg Ordered Dose -- Insufficient frequency information   oxyCODONE  (ROXICODONE ) 5 MG/5ML solution 5 mg Ordered Dose -- Insufficient frequency information   fentaNYL  (SUBLIMAZE ) 100 MCG/2ML injection Ordered Dose -- Frequency type could not be determined    Administered Dose 03/29/24 No morphine equivalence factor found for FENTANYL  CITRATE (PF) 100 MCG/2ML IJ SOLN [[id number]] and route  Patient benefited maximally from hospital stay and there were no complications.    Recent vital signs: Patient Vitals for the past 24 hrs:  BP Temp Temp src Pulse Resp SpO2  03/30/24 0750 109/68 98.4 F (36.9 C) Oral 64 16 96 %  03/30/24 0404 (!) 115/58 98.6 F (37 C) Oral 68 18 98 %  03/29/24 2348 105/63 98.9 F (37.2 C) Oral 60 18 99 %  03/29/24 1915 119/68 98.1 F (36.7 C) Oral 62 18 99 %  03/29/24 1628 (!) 172/77 98.5 F (36.9 C) Oral 64 19 100 %  03/29/24 1427 (!) 162/75 98.8 F (37.1 C) -- (!) 58 19 100 %  03/29/24 1400 (!) 165/76 (!) 97.5 F (36.4 C) -- (!) 55 (!) 7 100 %  03/29/24 1345 -- -- -- (!) 54 11 100 %  03/29/24 1330 -- -- -- (!) 52 11 97 %  03/29/24 1315 -- -- --  (!) 54 11 97 %  03/29/24 1300 (!) 154/91 -- -- (!) 57 (!) 9 100 %  03/29/24 1245 -- -- -- (!) 55 11 99 %  03/29/24 1230 -- -- -- (!) 53 12 99 %  03/29/24 1215 121/68 -- -- (!) 54 18 100 %  03/29/24 1202 118/70 (!) 94.3 F (34.6 C) -- 63 16 99 %  03/29/24 0935 (!) 140/67 -- -- 64 (!) 9 99 %  03/29/24 0930 (!) 162/75 -- -- 66 (!) 9 99 %  03/29/24 0925 132/77 -- -- 60 12 100 %     Recent laboratory studies: No results for input(s): WBC, HGB, HCT, PLT, NA, K, CL, CO2, BUN, CREATININE, GLUCOSE, INR, CALCIUM in the last 72 hours.  Invalid input(s): PT, 2   Discharge Medications:   Allergies as of 03/30/2024       Reactions   Ofloxacin Hives, Itching, Rash        Medication List     STOP taking these medications    ibuprofen 200 MG tablet Commonly known as: ADVIL   meloxicam  7.5 MG tablet Commonly known as: MOBIC        TAKE these medications    apixaban 2.5 MG Tabs tablet Commonly known as: Eliquis Take one tab po bid x 30 days after surgery to prevent blood clots   docusate sodium  100 MG capsule Commonly known as: Colace Take 1 capsule (100 mg total) by mouth daily as needed.   LORazepam  1 MG tablet Commonly known as: Ativan  Take 0.5 tablets (0.5 mg total) by mouth 3 (three) times daily as needed for anxiety (panic attacks).   MAGNESIUM PO Take 1 tablet by mouth at bedtime.   methocarbamol  750 MG tablet Commonly known as: ROBAXIN  Take 1 tablet (750 mg total) by mouth 3 (three) times daily as needed.   ondansetron  4 MG tablet Commonly known as: Zofran  Take 1 tablet (4 mg total) by mouth every 8 (eight) hours as needed for nausea or vomiting.   oxyCODONE -acetaminophen  5-325 MG tablet Commonly known as: Percocet Take 1-2 tablets by mouth every 6 (six) hours as needed. To be taken after surgery   Systane 0.4-0.3 % Soln Generic drug: Polyethyl Glycol-Propyl Glycol Place 1-2 drops into both eyes 3 (three) times daily as needed  (dry/irritated eyes.).   Zepbound 15 MG/0.5ML Pen Generic drug: tirzepatide Inject 10 mg into the skin See admin instructions. Inject 15 mg subcutaneously every 10 days               Durable Medical Equipment  (From admission, onward)  Start     Ordered   03/29/24 1422  DME Walker rolling  Once       Question Answer Comment  Walker: With 5 Inch Wheels   Patient needs a walker to treat with the following condition Status post left partial knee replacement      03/29/24 1421   03/29/24 1422  DME 3 n 1  Once        03/29/24 1421   03/29/24 1422  DME Bedside commode  Once       Question:  Patient needs a bedside commode to treat with the following condition  Answer:  Status post left partial knee replacement   03/29/24 1421            Diagnostic Studies: No results found.  Disposition: Discharge disposition: 01-Home or Self Care          Follow-up Information     Jule Ronal CROME, PA-C. Schedule an appointment as soon as possible for a visit in 2 week(s).   Specialty: Orthopedic Surgery Contact information: [address] [place]  [place] [address] [phone]         [facility] Follow up.   Why: [facility] will contact you for the first home visit Contact information: [phone]                 Signed: Ronal CROME Jule 03/30/2024, 8:09 AM

## 2024-03-30 NOTE — Progress Notes (Signed)
 Subjective: 1 Day Post-Op Procedure(s) (LRB): ARTHROPLASTY, KNEE, TOTAL (Right) Patient reports pain as mild.    Objective: Vital signs in last 24 hours: Temp:  [94.3 F (34.6 C)-98.9 F (37.2 C)] 98.4 F (36.9 C) (11/04 0750) Pulse Rate:  [52-68] 64 (11/04 0750) Resp:  [7-19] 16 (11/04 0750) BP: (105-172)/(58-91) 109/68 (11/04 0750) SpO2:  [96 %-100 %] 96 % (11/04 0750)  Intake/Output from previous day: 11/03 0701 - 11/04 0700 In: 2026 [P.O.:720; I.V.:1106; IV Piggyback:200] Out: 510 [Urine:500; Blood:10] Intake/Output this shift: No intake/output data recorded.  No results for input(s): HGB in the last 72 hours. No results for input(s): WBC, RBC, HCT, PLT in the last 72 hours. No results for input(s): NA, K, CL, CO2, BUN, CREATININE, GLUCOSE, CALCIUM in the last 72 hours. No results for input(s): LABPT, INR in the last 72 hours.  Neurologically intact Neurovascular intact Sensation intact distally Intact pulses distally Dorsiflexion/Plantar flexion intact Incision: dressing C/D/I No cellulitis present Compartment soft   Assessment/Plan: 1 Day Post-Op Procedure(s) (LRB): ARTHROPLASTY, KNEE, TOTAL (Right) Advance diet Up with therapy D/C IV fluids Discharge home with home health WBAT RLE      Ronal LITTIE Grave 03/30/2024, 8:07 AM

## 2024-04-03 ENCOUNTER — Encounter: Payer: Self-pay | Admitting: Orthopaedic Surgery

## 2024-04-05 ENCOUNTER — Other Ambulatory Visit: Payer: Self-pay | Admitting: Physician Assistant

## 2024-04-05 ENCOUNTER — Telehealth: Payer: Self-pay | Admitting: Orthopaedic Surgery

## 2024-04-05 MED ORDER — OXYCODONE-ACETAMINOPHEN 5-325 MG PO TABS: 1.0000 | ORAL_TABLET | Freq: Three times a day (TID) | ORAL | 0 refills | Status: DC | PRN

## 2024-04-05 NOTE — Telephone Encounter (Signed)
 Pt called requesting a refill of oxycodone . Please send to pharmacy on file. Pt states she has no pain meds. Pt phone number is [phone].

## 2024-04-13 ENCOUNTER — Ambulatory Visit (INDEPENDENT_AMBULATORY_CARE_PROVIDER_SITE_OTHER): Admitting: Physician Assistant

## 2024-04-13 DIAGNOSIS — Z96651 Presence of right artificial knee joint: Secondary | ICD-10-CM

## 2024-04-13 NOTE — Progress Notes (Signed)
 Post-Op Visit Note   Patient: Erin Williamson           Date of Birth: 10-Dec-1960           MRN: [id number] Visit Date: 04/13/2024 PCP: Mahlon Comer BRAVO, MD   Assessment & Plan:  Chief Complaint:  Chief Complaint  Patient presents with   Right Knee - Follow-up    Right TKA 03/29/2024   Visit Diagnoses:  1. Status post total right knee replacement     Plan: Patient is a pleasant 63 year old female who comes in today 2 weeks status post right total knee replacement.  She has been doing okay.  She has been taking Percocet for pain.  She has been compliant taking Eliquis for DVT prophylaxis.  She has been getting home health physical therapy and is ambulating with a single-point cane.  Examination of her right knee reveals a well-healing surgical incision with nylon sutures in place.  No evidence of infection or cellulitis.  Calves are soft nontender.  She is neurovascularly intact distally.  Today, sutures removed and Steri-Strips applied.  I sent in a referral for opioid PT and have also provided with a hardcopy prescription as she tells me they have told her she could work her in early this week.  She we will continue with her Eliquis for 2 more weeks and then transition to a baby aspirin  twice daily for 2 weeks.  Follow-up in 4 weeks for repeat evaluation and 2 view x-rays of the right knee.  Call with concerns or questions.  Follow-Up Instructions: Return in about 4 weeks (around 05/11/2024) for with xu.   Orders:  Orders Placed This Encounter  Procedures   Ambulatory referral to Physical Therapy   No orders of the defined types were placed in this encounter.   Imaging: No new imaging  PMFS History: Patient Active Problem List   Diagnosis Date Noted   Status post total right knee replacement 03/29/2024   Status post total left knee replacement 07/23/2022   Primary osteoarthritis of left knee 07/22/2022   Vitreous floaters of right eye 10/28/2021   HTN (hypertension)  05/22/2021   Endometrial thickening on ultrasound 04/26/2021   Physical exam 06/23/2019   Vitamin D  deficiency 06/23/2019   Family history of colon cancer 12/21/2018   Generalized anxiety disorder 12/21/2018   Hyperlipidemia 12/21/2018   Obesity (BMI 30-39.9) 12/21/2018   Past Medical History:  Diagnosis Date   Anxiety    Arthritis    GERD (gastroesophageal reflux disease)    Hyperlipidemia    Hypertension    Migraine    Osteoarthritis    left and right knee   PONV (postoperative nausea and vomiting)     Family History  Problem Relation Age of Onset   COPD Mother    Heart attack Mother    Heart disease Mother    Hyperlipidemia Mother    Hypertension Mother    Early death Father    Stroke Sister    Arthritis Sister    Colon cancer Sister    Mental illness Son    Rectal cancer Neg Hx    Stomach cancer Neg Hx    Esophageal cancer Neg Hx    Liver cancer Neg Hx    Pancreatic cancer Neg Hx     Past Surgical History:  Procedure Laterality Date   CESAREAN SECTION     1987 and 1998   TOTAL KNEE ARTHROPLASTY Left 07/23/2022   Procedure: LEFT TOTAL KNEE ARTHROPLASTY;  Surgeon: Jerri,  Kay HERO, MD;  Location: [facility] OR;  Service: Orthopedics;  Laterality: Left;   TOTAL KNEE ARTHROPLASTY Right 03/29/2024   Procedure: ARTHROPLASTY, KNEE, TOTAL;  Surgeon: Jerri Kay HERO, MD;  Location: [facility] OR;  Service: Orthopedics;  Laterality: Right;   tummy tuck  2000   Social History   Occupational History   Not on file  Tobacco Use   Smoking status: Former    Current packs/day: 0.00    Types: Cigarettes    Quit date: 05/27/1988    Years since quitting: 35.9   Smokeless tobacco: Never  Vaping Use   Vaping status: Never Used  Substance and Sexual Activity   Alcohol use: Yes    Comment: maybe 1 drink once a month   Drug use: Never   Sexual activity: Not Currently    Birth control/protection: None

## 2024-04-15 ENCOUNTER — Other Ambulatory Visit: Payer: Self-pay | Admitting: Physician Assistant

## 2024-04-15 MED ORDER — OXYCODONE-ACETAMINOPHEN 5-325 MG PO TABS: 1.0000 | ORAL_TABLET | Freq: Two times a day (BID) | ORAL | 0 refills | Status: DC | PRN

## 2024-04-19 MED ORDER — HYDROCODONE-ACETAMINOPHEN 5-325 MG PO TABS: 1.0000 | ORAL_TABLET | Freq: Two times a day (BID) | ORAL | 0 refills | Status: DC | PRN

## 2024-05-05 ENCOUNTER — Other Ambulatory Visit: Payer: Self-pay | Admitting: Physician Assistant

## 2024-05-05 NOTE — Telephone Encounter (Signed)
 No need for a refill as long as she got the total #60 pills that were initially sent

## 2024-05-05 NOTE — Telephone Encounter (Signed)
 Called patient. Advised her to take Aspirin  81mg  BID for 2 weeks, per Dr.Xu. Patient states understanding.

## 2024-05-07 MED ORDER — HYDROCODONE-ACETAMINOPHEN 5-325 MG PO TABS: 1.0000 | ORAL_TABLET | Freq: Every day | ORAL | 0 refills | Status: DC | PRN

## 2024-05-07 NOTE — Addendum Note (Signed)
 Addended by: JERRI LOISE SHARPER on: 05/07/2024 07:54 PM   Modules accepted: Orders

## 2024-05-11 ENCOUNTER — Other Ambulatory Visit: Payer: Self-pay

## 2024-05-11 ENCOUNTER — Ambulatory Visit: Admitting: Orthopaedic Surgery

## 2024-05-11 DIAGNOSIS — Z96651 Presence of right artificial knee joint: Secondary | ICD-10-CM

## 2024-05-11 MED ORDER — IBUPROFEN 800 MG PO TABS: 800.0000 mg | ORAL_TABLET | Freq: Three times a day (TID) | ORAL | 2 refills | Status: AC | PRN

## 2024-05-11 NOTE — Progress Notes (Signed)
 Post-Op Visit Note   Patient: Erin Williamson           Date of Birth: 23-Nov-1960           MRN: [id number] Visit Date: 05/11/2024 PCP: Mahlon Comer BRAVO, MD   Assessment & Plan:  Chief Complaint:  Chief Complaint  Patient presents with   Right Knee - Pain    R TKA 03/29/2024   Visit Diagnoses:  1. Status post total right knee replacement     Plan: History of Present Illness Erin Williamson is a 63 year old female who presents for postoperative follow-up after right total knee arthroplasty.  She is in outpatient physical therapy after two weeks of limited in-home therapy. Therapy notes she is slightly behind in achieving full knee extension. She can flex to about 100 degrees without pain. She is working on extension with a blue foam device and a stool under her desk but has limited time for exercises due to a physically active work schedule.  By the end of the workday she has throbbing right leg pain, worse after physical therapy and prolonged activity. She uses hydrocodone  as needed after therapy and work, and uses ice and elevation at home with relief. She is cautious with opioids due to prior heartburn and constipation on oxycodone , which she has not experienced with single-dose hydrocodone .  Right knee ROM is progressing nicely.  Scar fully healed.  Results Radiology Right knee X-ray (05/11/2024): Postoperative changes with satisfactory alignment and prosthesis positioning (Independently interpreted)  Assessment and Plan Status post right total knee arthroplasty Recovering well with satisfactory radiographic alignment and prosthesis positioning. Improved flexion to 100 degrees without pain, limited full extension. No complications or need for manipulation. Post-activity pain and inflammation noted. Completed Eliquis , nearing completion of aspirin  for DVT prophylaxis. Hydrocodone  use linked to delayed healing, constipation, and dyspepsia; transition to non-opioid analgesics  advised. - Continue outpatient physical therapy focusing on knee extension and flexion. - Encourage frequent extension exercises and use of blue foam device during non-working hours. - Reviewed radiographs; no complications, manipulation not indicated. - Continue aspirin  81 mg twice daily for one more week, then discontinue. - Discontinue Eliquis . - Prescribe ibuprofen  with a proton pump inhibitor to minimize gastrointestinal effects. - Minimize hydrocodone  use; transition to non-opioid analgesics. - Recommend ice and elevation for post-activity relief. - Follow-up in six weeks.  Follow-Up Instructions: Return in about 6 weeks (around 06/22/2024) for with lindsey.   Orders:  Orders Placed This Encounter  Procedures   XR KNEE 3 VIEW RIGHT   Meds ordered this encounter  Medications   ibuprofen  (ADVIL ) 800 MG tablet    Sig: Take 1 tablet (800 mg total) by mouth every 8 (eight) hours as needed.    Dispense:  30 tablet    Refill:  2    Imaging: XR KNEE 3 VIEW RIGHT Result Date: 05/11/2024 Stable total knee replacement without complication.   PMFS History: Patient Active Problem List   Diagnosis Date Noted   Status post total right knee replacement 03/29/2024   Status post total left knee replacement 07/23/2022   Primary osteoarthritis of left knee 07/22/2022   Vitreous floaters of right eye 10/28/2021   HTN (hypertension) 05/22/2021   Endometrial thickening on ultrasound 04/26/2021   Physical exam 06/23/2019   Vitamin D  deficiency 06/23/2019   Family history of colon cancer 12/21/2018   Generalized anxiety disorder 12/21/2018   Hyperlipidemia 12/21/2018   Obesity (BMI 30-39.9) 12/21/2018   Past Medical History:  Diagnosis  Date   Anxiety    Arthritis    GERD (gastroesophageal reflux disease)    Hyperlipidemia    Hypertension    Migraine    Osteoarthritis    left and right knee   PONV (postoperative nausea and vomiting)     Family History  Problem Relation Age of  Onset   COPD Mother    Heart attack Mother    Heart disease Mother    Hyperlipidemia Mother    Hypertension Mother    Early death Father    Stroke Sister    Arthritis Sister    Colon cancer Sister    Mental illness Son    Rectal cancer Neg Hx    Stomach cancer Neg Hx    Esophageal cancer Neg Hx    Liver cancer Neg Hx    Pancreatic cancer Neg Hx     Past Surgical History:  Procedure Laterality Date   CESAREAN SECTION     1987 and 1998   TOTAL KNEE ARTHROPLASTY Left 07/23/2022   Procedure: LEFT TOTAL KNEE ARTHROPLASTY;  Surgeon: Jerri Kay HERO, MD;  Location: [facility] OR;  Service: Orthopedics;  Laterality: Left;   TOTAL KNEE ARTHROPLASTY Right 03/29/2024   Procedure: ARTHROPLASTY, KNEE, TOTAL;  Surgeon: Jerri Kay HERO, MD;  Location: [facility] OR;  Service: Orthopedics;  Laterality: Right;   tummy tuck  2000   Social History   Occupational History   Not on file  Tobacco Use   Smoking status: Former    Current packs/day: 0.00    Types: Cigarettes    Quit date: 05/27/1988    Years since quitting: 35.9   Smokeless tobacco: Never  Vaping Use   Vaping status: Never Used  Substance and Sexual Activity   Alcohol  use: Yes    Comment: maybe 1 drink once a month   Drug use: Never   Sexual activity: Not Currently    Birth control/protection: None

## 2024-05-28 ENCOUNTER — Other Ambulatory Visit (HOSPITAL_BASED_OUTPATIENT_CLINIC_OR_DEPARTMENT_OTHER): Payer: Self-pay

## 2024-06-22 ENCOUNTER — Telehealth: Payer: Self-pay | Admitting: Physician Assistant

## 2024-06-22 ENCOUNTER — Ambulatory Visit: Admitting: Physician Assistant

## 2024-06-22 DIAGNOSIS — Z96651 Presence of right artificial knee joint: Secondary | ICD-10-CM

## 2024-06-22 MED ORDER — TRAMADOL HCL 50 MG PO TABS: 50.0000 mg | ORAL_TABLET | Freq: Every day | ORAL | 0 refills | Status: AC | PRN

## 2024-06-22 NOTE — Progress Notes (Signed)
 "  Post-Op Visit Note   Patient: Erin Williamson           Date of Birth: May 05, 1961           MRN: [id number] Visit Date: 06/22/2024 PCP: Mahlon Comer BRAVO, MD   Assessment & Plan:  Chief Complaint:  Chief Complaint  Patient presents with   Right Knee - Routine Post Op   Visit Diagnoses:  1. Status post total right knee replacement     Plan: Patient is a pleasant 64 year old female who comes in today approximately 12 weeks status post right total knee replacement.  She has been doing okay.  She is somewhat discouraged with progression in physical therapy compared to her left knee for which she had replaced 2 years ago.  She is also having some discomfort which is not completely relieved with ibuprofen .  Examination of the right knee reveals an extension lag of approximately 10 degrees.  She can flex to approximately 105 degrees.  She is stable to valgus varus stress.  She is neurovascular intact distally.  At this point, she will continue to work on range of motion exercises.  I sent in tramadol  to take as needed for pain.  She will follow-up in 3 months for repeat evaluation and 2 view x-rays of the right knee.  Call with concerns or questions.  Follow-Up Instructions: Return in about 3 months (around 09/20/2024).   Orders:  No orders of the defined types were placed in this encounter.  No orders of the defined types were placed in this encounter.   Imaging: No new imaging  PMFS History: Patient Active Problem List   Diagnosis Date Noted   Status post total right knee replacement 03/29/2024   Status post total left knee replacement 07/23/2022   Primary osteoarthritis of left knee 07/22/2022   Vitreous floaters of right eye 10/28/2021   HTN (hypertension) 05/22/2021   Endometrial thickening on ultrasound 04/26/2021   Physical exam 06/23/2019   Vitamin D  deficiency 06/23/2019   Family history of colon cancer 12/21/2018   Generalized anxiety disorder 12/21/2018    Hyperlipidemia 12/21/2018   Obesity (BMI 30-39.9) 12/21/2018   Past Medical History:  Diagnosis Date   Anxiety    Arthritis    GERD (gastroesophageal reflux disease)    Hyperlipidemia    Hypertension    Migraine    Osteoarthritis    left and right knee   PONV (postoperative nausea and vomiting)     Family History  Problem Relation Age of Onset   COPD Mother    Heart attack Mother    Heart disease Mother    Hyperlipidemia Mother    Hypertension Mother    Early death Father    Stroke Sister    Arthritis Sister    Colon cancer Sister    Mental illness Son    Rectal cancer Neg Hx    Stomach cancer Neg Hx    Esophageal cancer Neg Hx    Liver cancer Neg Hx    Pancreatic cancer Neg Hx     Past Surgical History:  Procedure Laterality Date   CESAREAN SECTION     1987 and 1998   TOTAL KNEE ARTHROPLASTY Left 07/23/2022   Procedure: LEFT TOTAL KNEE ARTHROPLASTY;  Surgeon: Jerri Kay HERO, MD;  Location: [facility] OR;  Service: Orthopedics;  Laterality: Left;   TOTAL KNEE ARTHROPLASTY Right 03/29/2024   Procedure: ARTHROPLASTY, KNEE, TOTAL;  Surgeon: Jerri Kay HERO, MD;  Location: [facility] OR;  Service: Orthopedics;  Laterality: Right;   tummy tuck  2000   Social History   Occupational History   Not on file  Tobacco Use   Smoking status: Former    Current packs/day: 0.00    Types: Cigarettes    Quit date: 05/27/1988    Years since quitting: 36.0   Smokeless tobacco: Never  Vaping Use   Vaping status: Never Used  Substance and Sexual Activity   Alcohol  use: Yes    Comment: maybe 1 drink once a month   Drug use: Never   Sexual activity: Not Currently    Birth control/protection: None     "

## 2024-06-22 NOTE — Telephone Encounter (Signed)
 Patient called. She would like her pain medication called in so she can pick it up after work today.

## 2024-06-22 NOTE — Telephone Encounter (Signed)
 Since it has been over 8 weeks since surgery, I can send tramadol .  Thanks.

## 2024-06-28 ENCOUNTER — Encounter: Payer: Self-pay | Admitting: Orthopaedic Surgery

## 2024-07-13 ENCOUNTER — Ambulatory Visit: Admitting: Orthopaedic Surgery

## 2024-09-01 ENCOUNTER — Encounter: Admitting: Family Medicine

## 2024-09-21 ENCOUNTER — Encounter: Admitting: Physician Assistant
